# Patient Record
Sex: Female | Born: 1990
Health system: Southern US, Community
[De-identification: ages and names within clinical notes are randomized; demographics above are authoritative.]

## PROBLEM LIST (undated history)

## (undated) ENCOUNTER — Inpatient Hospital Stay (HOSPITAL_COMMUNITY): Payer: Self-pay

## (undated) DIAGNOSIS — K219 Gastro-esophageal reflux disease without esophagitis: Secondary | ICD-10-CM

## (undated) DIAGNOSIS — F419 Anxiety disorder, unspecified: Secondary | ICD-10-CM

## (undated) DIAGNOSIS — F32A Depression, unspecified: Secondary | ICD-10-CM

## (undated) DIAGNOSIS — F329 Major depressive disorder, single episode, unspecified: Secondary | ICD-10-CM

## (undated) HISTORY — PX: WISDOM TOOTH EXTRACTION: SHX21

---

## 2012-12-09 NOTE — L&D Delivery Note (Signed)
I have seen and examined this patient and I agree with the above. Cam Hai 6:17 AM 08/21/2013

## 2012-12-09 NOTE — L&D Delivery Note (Signed)
Delivery Note At 12:35 AM a viable female was delivered via Vaginal, Spontaneous Delivery (Presentation: ; Occiput Anterior).  APGAR: 7, 8; weight 8 lb 2.2 oz (3690 g).   Placenta status: Intact, Spontaneous.  Cord: 3 vessels with the following complications: None.  Anesthesia: Epidural  Episiotomy: None Lacerations: None Suture Repair: n/a Est. Blood Loss (mL): 350  Mom to postpartum.  Baby to nursery-stable.  Jacquelin Hawking, MD 08/21/2013, 2:55 AM

## 2013-02-24 ENCOUNTER — Other Ambulatory Visit (HOSPITAL_COMMUNITY): Payer: Self-pay | Admitting: Nurse Practitioner

## 2013-02-24 DIAGNOSIS — Z3689 Encounter for other specified antenatal screening: Secondary | ICD-10-CM

## 2013-02-24 LAB — OB RESULTS CONSOLE GC/CHLAMYDIA
Chlamydia: NEGATIVE
Gonorrhea: NEGATIVE

## 2013-02-24 LAB — OB RESULTS CONSOLE ABO/RH

## 2013-02-24 LAB — OB RESULTS CONSOLE RPR: RPR: NONREACTIVE

## 2013-02-24 LAB — OB RESULTS CONSOLE HIV ANTIBODY (ROUTINE TESTING): HIV: NONREACTIVE

## 2013-03-26 ENCOUNTER — Ambulatory Visit (HOSPITAL_COMMUNITY)
Admission: RE | Admit: 2013-03-26 | Discharge: 2013-03-26 | Disposition: A | Discharge status: Home / Self Care | Payer: Medicaid Other | Source: Ambulatory Visit | Attending: Nurse Practitioner | Admitting: Nurse Practitioner

## 2013-03-26 ENCOUNTER — Ambulatory Visit (HOSPITAL_COMMUNITY): Payer: Self-pay

## 2013-03-26 DIAGNOSIS — Z1389 Encounter for screening for other disorder: Secondary | ICD-10-CM | POA: Insufficient documentation

## 2013-03-26 DIAGNOSIS — O358XX0 Maternal care for other (suspected) fetal abnormality and damage, not applicable or unspecified: Secondary | ICD-10-CM | POA: Insufficient documentation

## 2013-03-26 DIAGNOSIS — Z363 Encounter for antenatal screening for malformations: Secondary | ICD-10-CM | POA: Insufficient documentation

## 2013-03-26 DIAGNOSIS — Z3689 Encounter for other specified antenatal screening: Secondary | ICD-10-CM

## 2013-04-01 ENCOUNTER — Other Ambulatory Visit (HOSPITAL_COMMUNITY): Payer: Self-pay | Admitting: Physician Assistant

## 2013-04-01 DIAGNOSIS — Z0489 Encounter for examination and observation for other specified reasons: Secondary | ICD-10-CM

## 2013-04-01 DIAGNOSIS — IMO0002 Reserved for concepts with insufficient information to code with codable children: Secondary | ICD-10-CM

## 2013-04-06 ENCOUNTER — Encounter (HOSPITAL_COMMUNITY): Payer: Self-pay

## 2013-04-06 ENCOUNTER — Ambulatory Visit (HOSPITAL_COMMUNITY)
Admission: RE | Admit: 2013-04-06 | Discharge: 2013-04-06 | Disposition: A | Discharge status: Home / Self Care | Payer: Medicaid Other | Source: Ambulatory Visit | Attending: Physician Assistant | Admitting: Physician Assistant

## 2013-04-06 DIAGNOSIS — Z0489 Encounter for examination and observation for other specified reasons: Secondary | ICD-10-CM

## 2013-04-06 DIAGNOSIS — IMO0002 Reserved for concepts with insufficient information to code with codable children: Secondary | ICD-10-CM

## 2013-04-06 DIAGNOSIS — Z3689 Encounter for other specified antenatal screening: Secondary | ICD-10-CM | POA: Insufficient documentation

## 2013-04-09 ENCOUNTER — Ambulatory Visit (HOSPITAL_COMMUNITY): Payer: Medicaid Other

## 2013-05-31 ENCOUNTER — Encounter: Payer: Self-pay | Admitting: *Deleted

## 2013-05-31 ENCOUNTER — Ambulatory Visit (INDEPENDENT_AMBULATORY_CARE_PROVIDER_SITE_OTHER): Payer: Medicaid Other | Admitting: Cardiovascular Disease

## 2013-05-31 VITALS — BP 116/66 | HR 90 | Ht 62.5 in | Wt 172.8 lb

## 2013-05-31 DIAGNOSIS — R002 Palpitations: Secondary | ICD-10-CM | POA: Insufficient documentation

## 2013-05-31 NOTE — Patient Instructions (Addendum)
STAY HYDRATED   Your physician wants you to follow-up ON AS NEEDED BASIS.

## 2013-05-31 NOTE — Progress Notes (Signed)
     Joyce Schneider Date of Birth  1991-12-07       [facility]    [place] [address], [address]  [address], [address] [place], [place]  [address]   [place], [place]  [address] [phone]     [phone]   Fax  [phone]    Fax [phone]  Problem List: 1. Tachypalpitations 2.   History of Present Illness:  Joyce Schneider is a 22 yo - currently [redacted] weeks pregnant .  She presents today with tachypalps - started 2-3 weeks ago.  The palpitations last 1-2 minutes - perhaps as long as 3 minutes.  They cause her to feel somewhat short of breath and fatigued.   She has just started to exercise ( walking and zumba) and feels better this week after starting to exercise.    She is eating well.  Staying well hydrated.    No current outpatient prescriptions on file prior to visit.   No current facility-administered medications on file prior to visit.    No Known Allergies  History reviewed. No pertinent past medical history.  No past surgical history on file.  History  Smoking status  . Never Smoker   Smokeless tobacco  . Not on file    History  Alcohol Use No    History reviewed. No pertinent family history.  Reviw of Systems:  Reviewed in the HPI.  All other systems are negative.  Physical Exam: Blood pressure 116/66, pulse 90, height 5' 2.5" (1.588 m), weight 172 lb 12.8 oz (78.382 kg). General: Well developed, well nourished, in no acute distress.  Head: Normocephalic, atraumatic, sclera non-icteric, mucus membranes are moist,   Neck: Supple. Carotids are 2 + without bruits. No JVD   Lungs: Clear   Heart: RR, normal S1, S2  Abdomen: pregnant  Msk:  Strength and tone are normal   Extremities: No clubbing or cyanosis. Trace edema.  Distal pedal pulses are 2+ and equal    Neuro: CN II - XII intact.  Alert and oriented X 3.   Psych:  Normal   ECG: May 31, 2013:  NSR at 90. Normal ECG  Assessment / Plan:

## 2013-05-31 NOTE — Assessment & Plan Note (Signed)
Joyce Schneider  presents today with a complaint of palpitations. Her palpitations sound very benign. She likely is having premature ventricular contractions or perhaps premature atrial contractions. She thinks that these episodes may last for a minute or so but they have not caused her to be syncopal or presyncopal. She does have some fatigue.  She is currently [redacted] weeks pregnant.  She feels better now that she has been exercising.  I reassured her that these arrhythmias are benign. I've asked her to her pulses she has a sustained arrhythmia and to call me if her heart rate is greater than 150. I told her that her heart rate may get up to 110 or 120 and this would not be of any concern. I've asked her to stay well hydrated.  I'll see her back on an as-needed basis.

## 2013-07-30 ENCOUNTER — Inpatient Hospital Stay (HOSPITAL_COMMUNITY)
Admission: AD | Admit: 2013-07-30 | Discharge: 2013-07-30 | Disposition: A | Discharge status: Home / Self Care | Payer: Medicaid Other | Source: Ambulatory Visit | Attending: Obstetrics & Gynecology | Admitting: Obstetrics & Gynecology

## 2013-07-30 ENCOUNTER — Encounter (HOSPITAL_COMMUNITY): Payer: Self-pay | Admitting: *Deleted

## 2013-07-30 DIAGNOSIS — O99891 Other specified diseases and conditions complicating pregnancy: Secondary | ICD-10-CM | POA: Insufficient documentation

## 2013-07-30 DIAGNOSIS — O479 False labor, unspecified: Secondary | ICD-10-CM

## 2013-07-30 DIAGNOSIS — O4700 False labor before 37 completed weeks of gestation, unspecified trimester: Secondary | ICD-10-CM | POA: Insufficient documentation

## 2013-07-30 LAB — POCT FERN TEST: POCT Fern Test: NEGATIVE

## 2013-07-30 NOTE — MAU Note (Signed)
We were at store ago and I felt wet and underwear was wet. Contractions today but i haven't had any pain until waiting in lobby

## 2013-07-30 NOTE — MAU Note (Signed)
Patient states had appointment yesterday at [facility] department and was given medication for yeast infection. States had cervical check also and was 2cm. Reports good fetal movement. Denies bleeding.

## 2013-07-30 NOTE — MAU Provider Note (Signed)
  History     CSN: [id number]  Arrival date and time: 07/30/13 2039   First Provider Initiated Contact with Patient 07/30/13 2123      Chief Complaint  Patient presents with  . Contractions  . Rupture of Membranes   HPI  Joyce Schneider is a 22 y.o. G1P0 at [redacted]w[redacted]d who presents for a labor evaluation and rule out rupture of membranes. She reports she began feeling a painless tightening of her abdomen today. Also earlier today noticed her underwear was wet. This happened two times. It has not been a continuous trickle of fluid. Baby last moved within the last hour. No vaginal bleeding. Denies having any problems in this pregnancy.  OB History   Grav Para Term Preterm Abortions TAB SAB Ect Mult Living   1               History reviewed. No pertinent past medical history.  Past Surgical History  Procedure Laterality Date  . Wisdom tooth extraction      Family History  Problem Relation Age of Onset  . Hypertension Father     History  Substance Use Topics  . Smoking status: Never Smoker   . Smokeless tobacco: Not on file  . Alcohol Use: No    Allergies: No Known Allergies  Prescriptions prior to admission  Medication Sig Dispense Refill  . Prenatal Vit-Fe Fumarate-FA (PRENATAL PO) Take by mouth daily.        ROS + ctx, ?LOF, no bleeding, + fetal movement  Physical Exam   Blood pressure 126/85, pulse 109, temperature 98.1 F (36.7 C), resp. rate 18, height 5' 1.5" (1.562 m), weight 183 lb (83.008 kg), SpO2 100.00%. Physical Exam Gen: NAD Lungs:NWOB Neuro: grossly nonfocal, speech intact GU: normal appearing external genitalia. Sterile speculum shows no gross pooling of clear fluid. Dilation: 1 Effacement (%): 90 Cervical Position: Posterior Station: -3 Presentation: Vertex Exam by:: Dr. Pollie Meyer; Elonda Husky RN   MAU Course  Procedures  MDM Crist Fat slide negative No pooling on speculum exam  FHR: baseline 130s, moderate variability, + accels, no  decels Toco: ctx q2-5 minutes   Assessment and Plan  Joyce Schneider is a 22 y.o. G1P0 at [redacted]w[redacted]d who presents for labor eval / rule out rupture of membranes. With negative pooling and fern, very unlikely that ROM has taken place. Will observe for 1 hour and recheck cervix. If unchanged at that time, okay to d/c home with labor precautions.  Update: cervix rechecked by RN and is unchanged. Will d/c home.  Levert Feinstein 07/30/2013, 9:41 PM   I have seen and examined this patient and I agree with the above. Cam Hai 3:29 AM 07/31/2013

## 2013-08-19 ENCOUNTER — Encounter (HOSPITAL_COMMUNITY): Payer: Self-pay | Admitting: *Deleted

## 2013-08-19 ENCOUNTER — Inpatient Hospital Stay (HOSPITAL_COMMUNITY)
Admission: AD | Admit: 2013-08-19 | Discharge: 2013-08-23 | DRG: 775 | Disposition: A | Discharge status: Home / Self Care | Payer: Medicaid Other | Source: Ambulatory Visit | Attending: Obstetrics & Gynecology | Admitting: Obstetrics & Gynecology

## 2013-08-19 DIAGNOSIS — Z3490 Encounter for supervision of normal pregnancy, unspecified, unspecified trimester: Secondary | ICD-10-CM

## 2013-08-19 DIAGNOSIS — O4292 Full-term premature rupture of membranes, unspecified as to length of time between rupture and onset of labor: Secondary | ICD-10-CM

## 2013-08-19 DIAGNOSIS — O4290 Premature rupture of membranes, unspecified as to length of time between rupture and onset of labor, unspecified weeks of gestation: Principal | ICD-10-CM | POA: Diagnosis present

## 2013-08-19 LAB — CBC
MCH: 28.1 pg (ref 26.0–34.0)
Platelets: 151 10*3/uL (ref 150–400)
RBC: 4.73 MIL/uL (ref 3.87–5.11)
WBC: 10.5 10*3/uL (ref 4.0–10.5)

## 2013-08-19 LAB — POCT FERN TEST: POCT Fern Test: POSITIVE

## 2013-08-19 LAB — RPR: RPR Ser Ql: NONREACTIVE

## 2013-08-19 MED ORDER — OXYCODONE-ACETAMINOPHEN 5-325 MG PO TABS: 1.0000 | ORAL_TABLET | ORAL | Status: DC | PRN

## 2013-08-19 MED ORDER — FENTANYL CITRATE 0.05 MG/ML IJ SOLN
50.0000 ug | INTRAMUSCULAR | Status: DC | PRN
  Administered 2013-08-20 (×4): 50 ug via INTRAVENOUS
  Filled 2013-08-19 (×5): qty 2

## 2013-08-19 MED ORDER — OXYTOCIN 40 UNITS IN LACTATED RINGERS INFUSION - SIMPLE MED
1.0000 m[IU]/min | INTRAVENOUS | Status: DC
  Administered 2013-08-20: 2 m[IU]/min via INTRAVENOUS

## 2013-08-19 MED ORDER — OXYTOCIN BOLUS FROM INFUSION: 500.0000 mL | INTRAVENOUS | Status: DC

## 2013-08-19 MED ORDER — ACETAMINOPHEN 325 MG PO TABS
650.0000 mg | ORAL_TABLET | ORAL | Status: DC | PRN
  Administered 2013-08-20: 650 mg via ORAL
  Filled 2013-08-19: qty 2

## 2013-08-19 MED ORDER — LACTATED RINGERS IV SOLN
INTRAVENOUS | Status: DC
  Administered 2013-08-20 (×3): via INTRAVENOUS

## 2013-08-19 MED ORDER — TERBUTALINE SULFATE 1 MG/ML IJ SOLN: 0.2500 mg | Freq: Once | INTRAMUSCULAR | Status: AC | PRN

## 2013-08-19 MED ORDER — ONDANSETRON HCL 4 MG/2ML IJ SOLN
4.0000 mg | Freq: Four times a day (QID) | INTRAMUSCULAR | Status: DC | PRN
  Administered 2013-08-20: 4 mg via INTRAVENOUS
  Filled 2013-08-19: qty 2

## 2013-08-19 MED ORDER — OXYTOCIN 40 UNITS IN LACTATED RINGERS INFUSION - SIMPLE MED
62.5000 mL/h | INTRAVENOUS | Status: DC
  Filled 2013-08-19: qty 1000

## 2013-08-19 MED ORDER — CITRIC ACID-SODIUM CITRATE 334-500 MG/5ML PO SOLN: 30.0000 mL | ORAL | Status: DC | PRN

## 2013-08-19 MED ORDER — IBUPROFEN 600 MG PO TABS
600.0000 mg | ORAL_TABLET | Freq: Four times a day (QID) | ORAL | Status: DC | PRN
  Administered 2013-08-21: 600 mg via ORAL
  Filled 2013-08-19: qty 1

## 2013-08-19 MED ORDER — ZOLPIDEM TARTRATE 5 MG PO TABS: 5.0000 mg | ORAL_TABLET | Freq: Every evening | ORAL | Status: DC | PRN

## 2013-08-19 MED ORDER — LACTATED RINGERS IV SOLN: 500.0000 mL | INTRAVENOUS | Status: DC | PRN

## 2013-08-19 MED ORDER — LIDOCAINE HCL (PF) 1 % IJ SOLN
30.0000 mL | INTRAMUSCULAR | Status: DC | PRN
  Filled 2013-08-19: qty 30

## 2013-08-19 NOTE — Progress Notes (Signed)
Joyce Schneider is a 22 y.o. G1P0 at [redacted]w[redacted]d by admitted for rupture of membranes  Subjective: Doing well. Contractions not painful. Husband and mother-in-law in the room and supportive.  Objective: BP 137/80  Pulse 102  Temp(Src) 98.6 F (37 C) (Oral)  Resp 18  Ht 5' 1.5" (1.562 m)  Wt 85.276 kg (188 lb)  BMI 34.95 kg/m2     FHT:  FHR: 130 bpm, variability: moderate,  accelerations:  Present,  decelerations:  Absent UC:   regular, every 2-3 minutes SVE:   Dilation: 1.5 Effacement (%): 60 Station: -2 Exam by:: dr. Madie Reno  Labs: Lab Results  Component Value Date   WBC 10.5 08/19/2013   HGB 13.3 08/19/2013   HCT 38.5 08/19/2013   MCV 81.4 08/19/2013   PLT 151 08/19/2013    Assessment / Plan: Augmentation of labor, progressing well but cvx unchanged in 3 hours with PROM. Placed Foley bulb. Will start pitocin when cervix 3-4cm.   Labor: with need of induction, unable to give cytotec since contracting >3/86min. Placed Foley Bulub. Preeclampsia:  no signs or symptoms of toxicity and NA Fetal Wellbeing:  Category I Pain Control:  Labor support without medications I/D:  n/a Anticipated MOD:  NSVD  Marissa Calamity 08/19/2013, 6:44 PM  I spoke with and examined patient and agree with resident's note and plan of care.  Tawana Scale, MD OB Fellow 06/08/2014 7:20 PM

## 2013-08-19 NOTE — Progress Notes (Signed)
   Joyce Schneider is a 22 y.o. G1P0 at [redacted]w[redacted]d  admitted for PROM  Subjective: Contractions are not really stronger than before. LEaking small amounts of clear fluid  Objective: BP 137/75  Pulse 100  Temp(Src) 98.4 F (36.9 C) (Oral)  Resp 18  Ht 5' 1.5" (1.562 m)  Wt 85.276 kg (188 lb)  BMI 34.95 kg/m2    FHT:  FHR: 150 bpm, variability: moderate,  accelerations:  Present,  decelerations:  Absent UC:   irregular, every 2-5 minutes SVE:   Deferred.  Foley still in Labs: Lab Results  Component Value Date   WBC 10.5 08/19/2013   HGB 13.3 08/19/2013   HCT 38.5 08/19/2013   MCV 81.4 08/19/2013   PLT 151 08/19/2013    Assessment / Plan: PROM at term, ripening phase of IOL Will start Pitocin when foley falls out Labor: no Fetal Wellbeing:  Category I Pain Control:  Labor support without medications Anticipated MOD:  NSVD  CRESENZO-DISHMAN,Aijah Lattner 08/19/2013, 10:22 PM

## 2013-08-19 NOTE — H&P (Signed)
Joyce Schneider is a 22 y.o. female presenting for possible rupture of membranes at home. [facility] and they did a fern test (which they sent with her) that was positive. Was thinking maybe her membranes broke this AM but wasn't sure. Having some contractions that are very mild and not uncomfortable. No regular contractions.  Maternal Medical History:  Reason for admission: Rupture of membranes.   Contractions: Onset was 1-2 hours ago.   Frequency: rare.   Perceived severity is mild.    Fetal activity: Perceived fetal activity is normal.   Last perceived fetal movement was within the past 12 hours.    Prenatal complications: no prenatal complications No bleeding, cholelithiasis, HIV, hypertension, infection, IUGR, nephrolithiasis, oligohydramnios, placental abnormality, polyhydramnios, pre-eclampsia, preterm labor, substance abuse, thrombocytopenia or thrombophilia.   Prenatal Complications - Diabetes: none.    OB History   Grav Para Term Preterm Abortions TAB SAB Ect Mult Living   1              Past Medical History  Diagnosis Date  . Medical history non-contributory    Past Surgical History  Procedure Laterality Date  . Wisdom tooth extraction     Family History: family history includes Hypertension in her father. Social History:  reports that she has never smoked. She does not have any smokeless tobacco history on file. She reports that she does not drink alcohol or use illicit drugs.   Prenatal Transfer Tool  Maternal Diabetes: No Genetic Screening: Normal Maternal Ultrasounds/Referrals: Normal Fetal Ultrasounds or other Referrals:  None Maternal Substance Abuse:  No Significant Maternal Medications:  None Significant Maternal Lab Results:  None Other Comments:  None  Review of Systems  All other systems reviewed and are negative.    Dilation: 1.5 Effacement (%): 60 Station: -2 Exam by:: Dr. Madie Reno Blood pressure 128/81, pulse  104, temperature 98.3 F (36.8 C), temperature source Oral. Maternal Exam:  Uterine Assessment: Contraction strength is mild.  Contraction frequency is irregular.   Abdomen: Patient reports no abdominal tenderness. Fetal presentation: vertex  Introitus: Ferning test: positive.  Nitrazine test: not done. Amniotic fluid character: clear.  Pelvis: adequate for delivery.   Cervix: not evaluated.   Fetal Exam Fetal Monitor Review: Mode: ultrasound.   Baseline rate: 130.  Variability: moderate (6-25 bpm).   Pattern: accelerations present and no decelerations.    Fetal State Assessment: Category I - tracings are normal.     Physical Exam  Vitals reviewed. Constitutional: She appears well-developed and well-nourished.  HENT:  Head: Normocephalic.  Cardiovascular: Normal rate.   Respiratory: Effort normal.  GI: Soft.    Prenatal labs: ABO, Rh:  A pos Antibody:  neg Rubella:  immune RPR:   neg HBsAg:   neg HIV:   NR GBS:   Negative  Assessment/Plan: 1) PROM - Admit to L&D. Expectant mgmt. Will augment labor with pitocin if not making cervical change. GBS neg.   Marissa Calamity 08/19/2013, 3:31 PM  #Labor: Admit to L&D for monitoring, currently ctx, in no change will augment with pitocin #Pain: Desires natural birth #FWB: Cat I, continuous mointoring #ID: GBS negative  I spoke with and examined patient and agree with resident's note and plan of care.  Tawana Scale, MD OB Fellow 08/19/2013 4:10 PM

## 2013-08-19 NOTE — MAU Note (Signed)
[facility]. Pt reports  she has had an increase in yellow/green discharge. Reports increased pelvic pressure and cramping that is not very painful. Good fetal movement reported.

## 2013-08-20 ENCOUNTER — Encounter (HOSPITAL_COMMUNITY): Payer: Self-pay | Admitting: Anesthesiology

## 2013-08-20 ENCOUNTER — Inpatient Hospital Stay (HOSPITAL_COMMUNITY): Payer: Medicaid Other | Admitting: Anesthesiology

## 2013-08-20 LAB — CBC
Hemoglobin: 13.4 g/dL (ref 12.0–15.0)
MCHC: 34.4 g/dL (ref 30.0–36.0)
RDW: 15.8 % — ABNORMAL HIGH (ref 11.5–15.5)

## 2013-08-20 LAB — COMPREHENSIVE METABOLIC PANEL
AST: 20 U/L (ref 0–37)
CO2: 17 mEq/L — ABNORMAL LOW (ref 19–32)
Calcium: 9.4 mg/dL (ref 8.4–10.5)
Creatinine, Ser: 0.76 mg/dL (ref 0.50–1.10)
GFR calc non Af Amer: >90 mL/min (ref >90)

## 2013-08-20 LAB — ABO/RH: ABO/RH(D): A POS

## 2013-08-20 LAB — TYPE AND SCREEN
ABO/RH(D): A POS
Antibody Screen: NEGATIVE

## 2013-08-20 LAB — PROTEIN / CREATININE RATIO, URINE
Protein Creatinine Ratio: 0.33 — ABNORMAL HIGH (ref 0.00–0.15)
Total Protein, Urine: 93.8 mg/dL

## 2013-08-20 MED ORDER — EPHEDRINE 5 MG/ML INJ
INTRAVENOUS | Status: AC
  Filled 2013-08-20: qty 4

## 2013-08-20 MED ORDER — PHENYLEPHRINE 40 MCG/ML (10ML) SYRINGE FOR IV PUSH (FOR BLOOD PRESSURE SUPPORT)
80.0000 ug | PREFILLED_SYRINGE | INTRAVENOUS | Status: DC | PRN
  Filled 2013-08-20: qty 2

## 2013-08-20 MED ORDER — FENTANYL 2.5 MCG/ML BUPIVACAINE 1/10 % EPIDURAL INFUSION (WH - ANES)
14.0000 mL/h | INTRAMUSCULAR | Status: DC | PRN
  Administered 2013-08-20: 14 mL/h via EPIDURAL

## 2013-08-20 MED ORDER — FENTANYL 2.5 MCG/ML BUPIVACAINE 1/10 % EPIDURAL INFUSION (WH - ANES)
INTRAMUSCULAR | Status: AC
  Filled 2013-08-20: qty 125

## 2013-08-20 MED ORDER — EPHEDRINE 5 MG/ML INJ
10.0000 mg | INTRAVENOUS | Status: DC | PRN
  Filled 2013-08-20: qty 2

## 2013-08-20 MED ORDER — PHENYLEPHRINE 40 MCG/ML (10ML) SYRINGE FOR IV PUSH (FOR BLOOD PRESSURE SUPPORT)
PREFILLED_SYRINGE | INTRAVENOUS | Status: AC
  Filled 2013-08-20: qty 5

## 2013-08-20 MED ORDER — DIPHENHYDRAMINE HCL 50 MG/ML IJ SOLN: 12.5000 mg | INTRAMUSCULAR | Status: DC | PRN

## 2013-08-20 MED ORDER — LIDOCAINE HCL (PF) 1 % IJ SOLN
INTRAMUSCULAR | Status: DC | PRN
  Administered 2013-08-20 (×2): 5 mL

## 2013-08-20 MED ORDER — LACTATED RINGERS IV SOLN: 500.0000 mL | Freq: Once | INTRAVENOUS | Status: DC

## 2013-08-20 MED ORDER — OXYTOCIN 40 UNITS IN LACTATED RINGERS INFUSION - SIMPLE MED
1.0000 m[IU]/min | INTRAVENOUS | Status: DC
  Administered 2013-08-20: 2 m[IU]/min via INTRAVENOUS
  Administered 2013-08-20 (×2): 4 m[IU]/min via INTRAVENOUS

## 2013-08-20 NOTE — Progress Notes (Signed)
Patient ID: Joyce Schneider, female   DOB: 1990/12/12, 22 y.o.   MRN: [id number] Joyce Schneider is a 22 y.o. G1P0 at [redacted]w[redacted]d admitted for IOL for PROM  Subjective: Comfortable with epidural  Objective: BP 125/71  Pulse 121  Temp(Src) 99.1 F (37.3 C) (Axillary)  Resp 18  Ht 5' 1.5" (1.562 m)  Wt 188 lb (85.276 kg)  BMI 34.95 kg/m2  Fetal Heart FHR: 125 bpm, variability: moderate,  accelerations:  Present,  decelerations:  Absent   Contractions:   SVE:   Dilation: 6 Effacement (%): 100 Station: 0 Exam by:: hk Cx recheck by RN: no change. Pitocin started back, now at 4 mu  Assessment / Plan:  Labor: Active Fetal Wellbeing: Cat 1 Pain Control:  adequate Expected mode of delivery: NSVD  Joyce Schneider 08/20/2013, 7:02 PM

## 2013-08-20 NOTE — Anesthesia Procedure Notes (Signed)
Epidural Patient location during procedure: OB Start time: 08/20/2013 6:24 PM  Staffing Anesthesiologist: Angus Seller., Harrell Gave. Performed by: anesthesiologist   Preanesthetic Checklist Completed: patient identified, site marked, surgical consent, pre-op evaluation, timeout performed, IV checked, risks and benefits discussed and monitors and equipment checked  Epidural Patient position: sitting Prep: site prepped and draped and DuraPrep Patient monitoring: continuous pulse ox and blood pressure Approach: midline Injection technique: LOR air and LOR saline  Needle:  Needle type: Tuohy  Needle gauge: 17 G Needle length: 9 cm and 9 Needle insertion depth: 5 cm cm Catheter type: closed end flexible Catheter size: 19 Gauge Catheter at skin depth: 10 cm Test dose: negative  Assessment Events: blood not aspirated, injection not painful, no injection resistance, negative IV test and no paresthesia  Additional Notes Patient identified.  Risk benefits discussed including failed block, incomplete pain control, headache, nerve damage, paralysis, blood pressure changes, nausea, vomiting, reactions to medication both toxic or allergic, and postpartum back pain.  Patient expressed understanding and wished to proceed.  All questions were answered.  Sterile technique used throughout procedure and epidural site dressed with sterile barrier dressing. No paresthesia or other complications noted.The patient did not experience any signs of intravascular injection such as tinnitus or metallic taste in mouth nor signs of intrathecal spread such as rapid motor block. Please see nursing notes for vital signs.

## 2013-08-20 NOTE — Progress Notes (Signed)
Joyce Schneider is a 22 y.o. G1P0 at [redacted]w[redacted]d   Subjective: Feeling pressure/urge to push  Objective: BP 119/63  Pulse 140  Temp(Src) 99.2 F (37.3 C) (Axillary)  Resp 20  Ht 5' 1.5" (1.562 m)  Wt 85.276 kg (188 lb)  BMI 34.95 kg/m2      FHT:  FHR: 140-150 bpm, variability: moderate,  accelerations:  Present,  decelerations:  Absent occ mi variables UC:   regular, every 2-3 minutes with Pitocin SVE:   Dilation: 10 Effacement (%): 90 Station: +2 Exam by:: A.Davis,RN  Labs: Lab Results  Component Value Date   WBC 14.5* 08/20/2013   HGB 13.4 08/20/2013   HCT 39.0 08/20/2013   MCV 81.8 08/20/2013   PLT 136* 08/20/2013    Assessment / Plan: End 1st stage  Will begin pushing with urge Anticipate SVD  SHAW, KIMBERLY 08/20/2013, 11:16 PM

## 2013-08-20 NOTE — Progress Notes (Signed)
Patient ID: Joyce Schneider, female   DOB: [birth year], 22 y.o.   MRN: [id number] Joyce Schneider is a 22 y.o. G1P0 at [redacted]w[redacted]d admitted for IOL  Subjective: Coping well. Has had IV pain meds.   Objective: BP 133/82  Pulse 86  Temp(Src) 98.8 F (37.1 C) (Oral)  Resp 20  Ht 5' 1.5" (1.562 m)  Wt 188 lb (85.276 kg)  BMI 34.95 kg/m2  Fetal Heart FHR: 125 bpm, variability: moderate,  accelerations:  Present,  decelerations:  Absent   Contractions: q1-3. RN d/c'd pitocin due to UC frequency  SVE:   Dilation: 5.5 Effacement (%): 100 Station: 0 Exam by:: hk Dilation: 5.5 Effacement (%): 100 Cervical Position: Anterior Station: 0 Presentation: Vertex Exam by:: hk VE: 6-7/95/0, OA, clear AF (1555) Assessment / Plan:  Labor: Active Fetal Wellbeing: Cat 1 Pain Control:  IV meds Expected mode of delivery: NSVD  Joyce Schneider 08/20/2013, 3:53 PM

## 2013-08-20 NOTE — Progress Notes (Signed)
This note also relates to the following rows which could not be included: Dose (milli-units/min) Oxytocin - Cannot attach notes to extension rows Rate (mL/hr) Oxytocin - Cannot attach notes to extension rows Concentration Oxytocin - Cannot attach notes to extension rows   F. Cresenzo-Dishmon, cnm at bedside. Pitocin discontinued so pt can eat.  Will restart after breakfast.

## 2013-08-20 NOTE — Anesthesia Preprocedure Evaluation (Signed)

## 2013-08-20 NOTE — Progress Notes (Signed)
Dr. Penne Lash on unit.  Orders to go ahead and start pitocin with foley bulb in place.  SVE done.  Baby in vertex position.  Foley bulb tugged on still tight in cervix.  Pitocin started per orders

## 2013-08-20 NOTE — Progress Notes (Signed)
Patient ID: Mauriah Mcmillen, female   DOB: [birth year], 22 y.o.   MRN: [id number] Brody Kump is a 22 y.o. G1P0 at [redacted]w[redacted]d admitted for PROM  Subjective: Coping with contraction pain. Had FB, then pitocin. No H/A, visual sx, epigastric pain.   Objective: BP 137/95  Pulse 96  Temp(Src) 98.7 F (37.1 C) (Oral)  Resp 18  Ht 5' 1.5" (1.562 m)  Wt 188 lb (85.276 kg)  BMI 34.95 kg/m2 Filed Vitals:   08/20/13 0952 08/20/13 1040 08/20/13 1112 08/20/13 1204  BP: 145/91 129/71 133/84 137/95  Pulse: 95 82 94 96  Temp: 97.9 F (36.6 C)   98.7 F (37.1 C)  TempSrc: Oral   Oral  Resp:    18  Height:      Weight:       Fetal Heart FHR: 130 bpm, variability: moderate,  accelerations:  Present,  decelerations:  Absent   Contractions: q x50-60 sec  SVE:   Dilation: 6 Effacement (%): 90 Station: -1 Exam by:: tina, snm/Linet Brash, cnm Cx unchanged from RN last exam. UCs frequent on pitocin 2 mu. Bulging forebag>AROM clear  Assessment / Plan: CMP and P:C ratio sent due to 4 elevated BPs noted Labor: Active Fetal Wellbeing: Cat 1 Pain Control:  Declines analgesia Expected mode of delivery: NSVD  Anays Detore 08/20/2013, 12:39 PM

## 2013-08-21 ENCOUNTER — Encounter (HOSPITAL_COMMUNITY): Payer: Self-pay | Admitting: *Deleted

## 2013-08-21 DIAGNOSIS — O4290 Premature rupture of membranes, unspecified as to length of time between rupture and onset of labor, unspecified weeks of gestation: Secondary | ICD-10-CM

## 2013-08-21 MED ORDER — ONDANSETRON HCL 4 MG/2ML IJ SOLN
4.0000 mg | INTRAMUSCULAR | Status: DC | PRN
Start: 2013-08-21 — End: 2013-08-23

## 2013-08-21 MED ORDER — BENZOCAINE-MENTHOL 20-0.5 % EX AERO
1.0000 "application " | INHALATION_SPRAY | CUTANEOUS | Status: DC | PRN
  Administered 2013-08-21 – 2013-08-23 (×2): 1 via TOPICAL
  Filled 2013-08-21 (×2): qty 56

## 2013-08-21 MED ORDER — SENNOSIDES-DOCUSATE SODIUM 8.6-50 MG PO TABS
2.0000 | ORAL_TABLET | ORAL | Status: DC
  Administered 2013-08-21 – 2013-08-22 (×2): 2 via ORAL

## 2013-08-21 MED ORDER — DIBUCAINE 1 % RE OINT: 1.0000 "application " | TOPICAL_OINTMENT | RECTAL | Status: DC | PRN

## 2013-08-21 MED ORDER — INFLUENZA VAC SPLIT QUAD 0.5 ML IM SUSP
0.5000 mL | INTRAMUSCULAR | Status: AC
  Administered 2013-08-22: 0.5 mL via INTRAMUSCULAR
  Filled 2013-08-21: qty 0.5

## 2013-08-21 MED ORDER — OXYCODONE-ACETAMINOPHEN 5-325 MG PO TABS
1.0000 | ORAL_TABLET | ORAL | Status: DC | PRN
  Administered 2013-08-21 – 2013-08-23 (×8): 1 via ORAL
  Filled 2013-08-21 (×8): qty 1

## 2013-08-21 MED ORDER — ONDANSETRON HCL 4 MG PO TABS: 4.0000 mg | ORAL_TABLET | ORAL | Status: DC | PRN

## 2013-08-21 MED ORDER — IBUPROFEN 600 MG PO TABS
600.0000 mg | ORAL_TABLET | Freq: Four times a day (QID) | ORAL | Status: DC
  Administered 2013-08-21 – 2013-08-23 (×8): 600 mg via ORAL
  Filled 2013-08-21 (×8): qty 1

## 2013-08-21 MED ORDER — SIMETHICONE 80 MG PO CHEW: 80.0000 mg | CHEWABLE_TABLET | ORAL | Status: DC | PRN

## 2013-08-21 MED ORDER — LANOLIN HYDROUS EX OINT: TOPICAL_OINTMENT | CUTANEOUS | Status: DC | PRN

## 2013-08-21 MED ORDER — PRENATAL MULTIVITAMIN CH
1.0000 | ORAL_TABLET | Freq: Every day | ORAL | Status: DC
  Administered 2013-08-21 – 2013-08-22 (×2): 1 via ORAL
  Filled 2013-08-21 (×2): qty 1

## 2013-08-21 MED ORDER — WITCH HAZEL-GLYCERIN EX PADS
1.0000 "application " | MEDICATED_PAD | CUTANEOUS | Status: DC | PRN
  Administered 2013-08-21: 1 via TOPICAL

## 2013-08-21 MED ORDER — ERYTHROMYCIN 5 MG/GM OP OINT
TOPICAL_OINTMENT | OPHTHALMIC | Status: AC
  Filled 2013-08-21: qty 1

## 2013-08-21 MED ORDER — ZOLPIDEM TARTRATE 5 MG PO TABS: 5.0000 mg | ORAL_TABLET | Freq: Every evening | ORAL | Status: DC | PRN

## 2013-08-21 MED ORDER — TETANUS-DIPHTH-ACELL PERTUSSIS 5-2.5-18.5 LF-MCG/0.5 IM SUSP
0.5000 mL | Freq: Once | INTRAMUSCULAR | Status: AC
  Administered 2013-08-21: 0.5 mL via INTRAMUSCULAR

## 2013-08-21 MED ORDER — DIPHENHYDRAMINE HCL 25 MG PO CAPS: 25.0000 mg | ORAL_CAPSULE | Freq: Four times a day (QID) | ORAL | Status: DC | PRN

## 2013-08-21 NOTE — Lactation Note (Signed)
This note was copied from the chart of Boy Lauryn Lizardi. Lactation Consultation Note Initial consultation, baby now 52 hours old, mom states breast feeding has not gone well so far, states her nipples are flat and baby does not latch. Offered to assist, mom accepts. Assisted to place baby in football on left, mom's areola are very edematous and the nipple flattens out with compression. Baby unable to get a latch. Initiated nipple shield, baby latched better. Unable to hand express visible colostrum, mom concerned that baby isn't getting any milk; nipple shield dry. Discussed options to supplement baby; discussed the pros and cons of bottle vs. Supplementing at the breast. Mom and dad agree to try curved tip syringe at the nipple shield. Demonstrated procedure to dad, dad then fed baby 5 mL formula using curved tip syringe inserted into nipple shield. Mom and dad feel better about baby being fed and feeding at the breast. Reviewed br feeding basics, reviewed baby and me book, lactation brochure, community resources with mom; answered questions. Enc mom to continue frequent STS and cue based feeding, to continue attempting to feed baby at the breast, and to supplement as needed. Enc mom to call for help if she has any concerns.   Patient Name: Boy Minela [place] [id number] Date: 08/21/2013 Reason for consult: Initial assessment   Maternal Data Formula Feeding for Exclusion: Yes Reason for exclusion: Mother's choice to formula and breast feed on admission Infant to breast within first hour of birth: No (try; did not latch) Has patient been taught Hand Expression?: Yes Does the patient have breastfeeding experience prior to this delivery?: No  Feeding Feeding Type: Formula Length of feed: 30 min  LATCH Score/Interventions Latch: Repeated attempts needed to sustain latch, nipple held in mouth throughout feeding, stimulation needed to elicit sucking reflex. Intervention(s): Skin to skin;Teach feeding  cues;Waking techniques Intervention(s): Breast massage;Breast compression;Assist with latch;Adjust position  Audible Swallowing: Spontaneous and intermittent (using syringe with formula)  Type of Nipple: Flat Intervention(s): Reverse pressure;Hand pump  Comfort (Breast/Nipple): Soft / non-tender     Hold (Positioning): Assistance needed to correctly position infant at breast and maintain latch. Intervention(s): Breastfeeding basics reviewed;Support Pillows;Position options;Skin to skin  LATCH Score: 7  Lactation Tools Discussed/Used Tools: Nipple Dorris Carnes;Other (comment) (curved tip syringe at nipple shield)   Consult Status Consult Status: Follow-up    Octavio Manns [facility] 08/21/2013, 3:08 PM

## 2013-08-21 NOTE — Anesthesia Postprocedure Evaluation (Signed)
  Anesthesia Post-op Note  Patient: Joyce Schneider  Procedure(s) Performed: * No procedures listed *  Patient Location: Mother/Baby  Anesthesia Type:Epidural  Level of Consciousness: alert   Airway and Oxygen Therapy: Patient Spontanous Breathing  Post-op Pain: mild  Post-op Assessment: Patient's Cardiovascular Status Stable, Respiratory Function Stable, No signs of Nausea or vomiting, Pain level controlled, No headache, No residual numbness and No residual motor weakness  Post-op Vital Signs: stable  Complications: No apparent anesthesia complications

## 2013-08-22 MED ORDER — IBUPROFEN 600 MG PO TABS
600.0000 mg | ORAL_TABLET | Freq: Four times a day (QID) | ORAL | Status: DC | PRN
Start: 2013-08-22 — End: 2013-09-01

## 2013-08-22 NOTE — Discharge Instructions (Signed)
Offices that do circumcisions: [facility] [phone] [facility]) $317.20 within 4 weeks of birth, [facility] [phone] [facility]) $250 within 7 days of birth, [facility] [phone] [facility]) $175 within 2 weeks of birth  Postpartum Care After Vaginal Delivery After you deliver your newborn (postpartum period), the usual stay in the hospital is 24 72 hours. If there were problems with your labor or delivery, or if you have other medical problems, you might be in the hospital longer.  While you are in the hospital, you will receive help and instructions on how to care for yourself and your newborn during the postpartum period.  While you are in the hospital:  Be sure to tell your nurses if you have pain or discomfort, as well as where you feel the pain and what makes the pain worse.  If you had an incision made near your vagina (episiotomy) or if you had some tearing during delivery, the nurses may put ice packs on your episiotomy or tear. The ice packs may help to reduce the pain and swelling.  If you are breastfeeding, you may feel uncomfortable contractions of your uterus for a couple of weeks. This is normal. The contractions help your uterus get back to normal size.  It is normal to have some bleeding after delivery.  For the first 1 3 days after delivery, the flow is red and the amount may be similar to a period.  It is common for the flow to start and stop.  In the first few days, you may pass some small clots. Let your nurses know if you begin to pass large clots or your flow increases.  Do not  flush blood clots down the toilet before having the nurse look at them.  During the next 3 10 days after delivery, your flow should become more watery and pink or brown-tinged in color.  Ten to fourteen days after delivery, your flow should be a small amount of yellowish-white discharge.  The amount of your flow will decrease over the first few weeks after delivery.  Your flow may stop in 6 8 weeks. Most women have had their flow stop by 12 weeks after delivery.  You should change your sanitary pads frequently.  Wash your hands thoroughly with soap and water for at least 20 seconds after changing pads, using the toilet, or before holding or feeding your newborn.  You should feel like you need to empty your bladder within the first 6 8 hours after delivery.  In case you become weak, lightheaded, or faint, call your nurse before you get out of bed for the first time and before you take a shower for the first time.  Within the first few days after delivery, your breasts may begin to feel tender and full. This is called engorgement. Breast tenderness usually goes away within 48 72 hours after engorgement occurs. You may also notice milk leaking from your breasts. If you are not breastfeeding, do not stimulate your breasts. Breast stimulation can make your breasts produce more milk.  Spending as much time as possible with your newborn is very important. During this time, you and your newborn can feel close and get to know each other. Having your newborn stay in your room (rooming in) will help to strengthen the bond with your newborn. It will give you time to get to know your newborn and become comfortable caring for your newborn.  Your hormones change after delivery. Sometimes the hormone changes can temporarily cause you  to feel sad or tearful. These feelings should not last more than a few days. If these feelings last longer than that, you should talk to your caregiver.  If desired, talk to your caregiver about methods of family planning or contraception.  Talk to your caregiver about immunizations. Your caregiver may want you to have the following immunizations before leaving the hospital:  Tetanus, diphtheria, and pertussis (Tdap) or tetanus and diphtheria (Td) immunization. It is very important that you and your family (including grandparents) or others  caring for your newborn are up-to-date with the Tdap or Td immunizations. The Tdap or Td immunization can help protect your newborn from getting ill.  Rubella immunization.  Varicella (chickenpox) immunization.  Influenza immunization. You should receive this annual immunization if you did not receive the immunization during your pregnancy. Document Released: 09/22/2007 Document Revised: 08/19/2012 Document Reviewed: 07/22/2012 [facility] Patient Information 2014 [place], [place].  Breastfeeding A change in hormones during your pregnancy causes growth of your breast tissue and an increase in number and size of milk ducts. The hormone prolactin allows proteins, sugars, and fats from your blood supply to make breast milk in your milk-producing glands. The hormone progesterone prevents breast milk from being released before the birth of your baby. After the birth of your baby, your progesterone level decreases allowing breast milk to be released. Thoughts of your baby, as well as his or her sucking or crying, can stimulate the release of milk from the milk-producing glands. Deciding to breastfeed (nurse) is one of the best choices you can make for you and your baby. The information that follows gives a brief review of the benefits, as well as other important skills to know about breastfeeding. BENEFITS OF BREASTFEEDING For your baby  The first milk (colostrum) helps your baby's digestive system function better.   There are antibodies in your milk that help your baby fight off infections.   Your baby has a lower incidence of asthma, allergies, and sudden infant death syndrome (SIDS).   The nutrients in breast milk are better for your baby than infant formulas.  Breast milk improves your baby's brain development.   Your baby will have less gas, colic, and constipation.  Your baby is less likely to develop other conditions, such as childhood obesity, asthma, or diabetes mellitus. For  you  Breastfeeding helps develop a very special bond between you and your baby.   Breastfeeding is convenient, always available at the correct temperature, and costs nothing.   Breastfeeding helps to burn calories and helps you lose the weight gained during pregnancy.   Breastfeeding makes your uterus contract back down to normal size faster and slows bleeding following delivery.   Breastfeeding mothers have a lower risk of developing osteoporosis or breast or ovarian cancer later in life.  BREASTFEEDING FREQUENCY  A healthy, full-term baby may breastfeed as often as every hour or space his or her feedings to every 3 hours. Breastfeeding frequency will vary from baby to baby.   Newborns should be fed no less than every 2 3 hours during the day and every 4 5 hours during the night. You should breastfeed a minimum of 8 feedings in a 24 hour period.  Awaken your baby to breastfeed if it has been 3 4 hours since the last feeding.  Breastfeed when you feel the need to reduce the fullness of your breasts or when your newborn shows signs of hunger. Signs that your baby may be hungry include:  Increased alertness or activity.  Stretching.  Movement of the head from side to side.  Movement of the head and opening of the mouth when the corner of the mouth or cheek is stroked (rooting).  Increased sucking sounds, smacking lips, cooing, sighing, or squeaking.  Hand-to-mouth movements.  Increased sucking of fingers or hands.  Fussing.  Intermittent crying.  Signs of extreme hunger will require calming and consoling before you try to feed your baby. Signs of extreme hunger may include:  Restlessness.  A loud, strong cry.  Screaming.  Frequent feeding will help you make more milk and will help prevent problems, such as sore nipples and engorgement of the breasts.  BREASTFEEDING   Whether lying down or sitting, be sure that the baby's abdomen is facing your abdomen.    Support your breast with 4 fingers under your breast and your thumb above your nipple. Make sure your fingers are well away from your nipple and your baby's mouth.   Stroke your baby's lips gently with your finger or nipple.   When your baby's mouth is open wide enough, place all of your nipple and as much of the colored area around your nipple (areola) as possible into your baby's mouth.  More areola should be visible above his or her upper lip than below his or her lower lip.  Your baby's tongue should be between his or her lower gum and your breast.  Ensure that your baby's mouth is correctly positioned around the nipple (latched). Your baby's lips should create a seal on your breast.  Signs that your baby has effectively latched onto your nipple include:  Tugging or sucking without pain.  Swallowing heard between sucks.  Absent click or smacking sound.  Muscle movement above and in front of his or her ears with sucking.  Your baby must suck about 2 3 minutes in order to get your milk. Allow your baby to feed on each breast as long as he or she wants. Nurse your baby until he or she unlatches or falls asleep at the first breast, then offer the second breast.  Signs that your baby is full and satisfied include:  A gradual decrease in the number of sucks or complete cessation of sucking.  Falling asleep.  Extension or relaxation of his or her body.  Retention of a small amount of milk in his or her mouth.  Letting go of your breast by himself or herself.  Signs of effective breastfeeding in you include:  Breasts that have increased firmness, weight, and size prior to feeding.  Breasts that are softer after nursing.  Increased milk volume, as well as a change in milk consistency and color by the 5th day of breastfeeding.  Breast fullness relieved by breastfeeding.  Nipples are not sore, cracked, or bleeding.  If needed, break the suction by putting your finger  into the corner of your baby's mouth and sliding your finger between his or her gums. Then, remove your breast from his or her mouth.  It is common for babies to spit up a small amount after a feeding.  Babies often swallow air during feeding. This can make babies fussy. Burping your baby between breasts can help with this.  Vitamin D supplements are recommended for babies who get only breast milk.  Avoid using a pacifier during your baby's first 4 6 weeks.  Avoid supplemental feedings of water, formula, or juice in place of breastfeeding. Breast milk is all the food your baby needs. It is not necessary for  your baby to have water or formula. Your breasts will make more milk if supplemental feedings are avoided during the early weeks. HOW TO TELL WHETHER YOUR BABY IS GETTING ENOUGH BREAST MILK Wondering whether or not your baby is getting enough milk is a common concern among mothers. You can be assured that your baby is getting enough milk if:   Your baby is actively sucking and you hear swallowing.   Your baby seems relaxed and satisfied after a feeding.   Your baby nurses at least 8 12 times in a 24 hour time period.  During the first 66 22 days of age:  Your baby is wetting at least 3 5 diapers in a 24 hour period. The urine should be clear and pale yellow.  Your baby is having at least 3 4 stools in a 24 hour period. The stool should be soft and yellow.  At 34 70 days of age, your baby is having at least 3 6 stools in a 24 hour period. The stool should be seedy and yellow by 59 days of age.  Your baby has a weight loss less than 7 10% during the first 36 days of age.  Your baby does not lose weight after 33 62 days of age.  Your baby gains 4 7 ounces each week after he or she is 16 days of age.  Your baby gains weight by 79 days of age and is back to birth weight within 2 weeks. ENGORGEMENT In the first week after your baby is born, you may experience extremely full breasts  (engorgement). When engorged, your breasts may feel heavy, warm, or tender to the touch. Engorgement peaks within 24 48 hours after delivery of your baby.  Engorgement may be reduced by:  Continuing to breastfeed.  Increasing the frequency of breastfeeding.  Taking warm showers or applying warm, moist heat to your breasts just before each feeding. This increases circulation and helps the milk flow.   Gently massaging your breast before and during the feedings. With your fingertips, massage from your chest wall towards your nipple in a circular motion.   Ensuring that your baby empties at least one breast at every feeding. It also helps to start the next feeding on the opposite breast.   Expressing breast milk by hand or by using a breast pump to empty the breasts if your baby is sleepy, or not nursing well. You may also want to express milk if you are returning to work oryou feel you are getting engorged.  Ensuring your baby is latched on and positioned properly while breastfeeding. If you follow these suggestions, your engorgement should improve in 24 48 hours. If you are still experiencing difficulty, call your lactation consultant or caregiver.  CARING FOR YOURSELF Take care of your breasts.  Bathe or shower daily.   Avoid using soap on your nipples.   Wear a supportive bra. Avoid wearing underwire style bras.  Air dry your nipples for a 3 after each feeding.   Use only cotton bra pads to absorb breast milk leakage. Leaking of breast milk between feedings is normal.   Use only pure lanolin on your nipples after nursing. You do not need to wash it off before feeding your baby again. Another option is to express a few drops of breast milk and gently massage that milk into your nipples.  Continue breast self-awareness checks. Take care of yourself.  Eat healthy foods. Alternate 3 meals with 3 snacks.  Avoid foods  that you notice affect your baby in a bad  way.  Drink milk, fruit juice, and water to satisfy your thirst (about 8 glasses a day).   Rest often, relax, and take your prenatal vitamins to prevent fatigue, stress, and anemia.  Avoid chewing and smoking tobacco.  Avoid alcohol and drug use.  Take over-the-counter and prescribed medicine only as directed by your caregiver or pharmacist. You should always check with your caregiver or pharmacist before taking any new medicine, vitamin, or herbal supplement.  Know that pregnancy is possible while breastfeeding. If desired, talk to your caregiver about family planning and safe birth control methods that may be used while breastfeeding. SEEK MEDICAL CARE IF:   You feel like you want to stop breastfeeding or have become frustrated with breastfeeding.  You have painful breasts or nipples.  Your nipples are cracked or bleeding.  Your breasts are red, tender, or warm.  You have a swollen area on either breast.  You have a fever or chills.  You have nausea or vomiting.  You have drainage from your nipples.  Your breasts do not become full before feedings by the 5th day after delivery.  You feel sad and depressed.  Your baby is too sleepy to eat well.  Your baby is having trouble sleeping.   Your baby is wetting less than 3 diapers in a 24 hour period.  Your baby has less than 3 stools in a 24 hour period.  Your baby's skin or the white part of his or her eyes becomes more yellow.   Your baby is not gaining weight by 79 days of age. MAKE SURE YOU:   Understand these instructions.  Will watch your condition.  Will get help right away if you are not doing well or get worse. Document Released: 11/25/2005 Document Revised: 08/19/2012 Document Reviewed: 07/01/2012 [facility] Patient Information 2014 [place], [place].

## 2013-08-22 NOTE — Discharge Summary (Signed)
Obstetric Discharge Summary Reason for Admission: rupture of membranes Prenatal Procedures: ultrasound Intrapartum Procedures: spontaneous vaginal delivery Postpartum Procedures: none Complications-Operative and Postpartum: none Eating, drinking, voiding, ambulating well.  +flatus.  Lochia and pain wnl.  Denies dizziness, lightheadedness, or sob. No complaints.  Desires early d/c Hemoglobin  Date Value Range Status  08/20/2013 13.4  12.0 - 15.0 g/dL Final     HCT  Date Value Range Status  08/20/2013 39.0  36.0 - 46.0 % Final    Physical Exam:  General: alert, cooperative and no distress Lochia: appropriate Uterine Fundus: firm Incision: n/a DVT Evaluation: No evidence of DVT seen on physical exam. Negative Homan's sign. No cords or calf tenderness. No significant calf/ankle edema.  Discharge Diagnoses: Term Pregnancy-delivered  Discharge Information: Date: 08/22/2013 Activity: pelvic rest Diet: routine Medications: PNV and Ibuprofen Condition: stable Instructions: refer to practice specific booklet Discharge to: home Follow-up Information   Follow up with [facility] In 6 weeks. (for your postpartum visit)    Contact information:   [address] [place] [address] [phone]      Newborn Data: Live born female  Birth Weight: 8 lb 2.2 oz (3690 g) APGAR: 7, 8  Home with mother. Breast/bottlefeeding, condoms for contraception, OP circumcision  Marge Duncans 08/22/2013, 7:46 AM

## 2013-08-22 NOTE — Discharge Summary (Signed)

## 2013-08-23 NOTE — Discharge Summary (Signed)
Obstetric Discharge Summary Reason for Admission: rupture of membranes Prenatal Procedures: none Intrapartum Procedures: spontaneous vaginal delivery Postpartum Procedures: none Complications-Operative and Postpartum: none   Hemoglobin  Date Value Range Status  08/20/2013 13.4  12.0 - 15.0 g/dL Final     HCT  Date Value Range Status  08/20/2013 39.0  36.0 - 46.0 % Final    Physical Exam:  General: alert, cooperative and no distress Lochia: appropriate Uterine Fundus: firm Incision: n/a DVT Evaluation: Negative Homan's sign.  Calf/Ankle edema is present. Up to TT, 2+. No discoloration (Not significantly changed from edema during pregnancy) Heart: Sinus arrythmia, without MRG Lungs: No accessory muscle use. Lungs CTA.  Discharge Diagnoses: Term Pregnancy-delivered  Discharge Information: Date: 08/23/2013 Activity: unrestricted Diet: routine Medications: Ibuprofen and Percocet Condition: stable Instructions: refer to practice specific booklet Discharge to: home Follow-up Information   Follow up with [facility] In 6 weeks. (for your postpartum visit)    Contact information:   [address] [place] [address] [phone]      Joyce Schneider is a 22yo G1P1001 who presented to the MAU for ROM and progressed to SVD with epidural anestesia. 3 vessel cord, spontaneous placenta, EBL of without lacertaions. Mom is doing well with pain adequately controlled with Percocet and Ibuprofen. Bleeding is appropriate. She denies N, V, or dizziness, but has endorsed some intermittent mid-epigastric (non-exertional and without dyspnea, diaphoresis, increased work of breathing, or hemoptysis), drinking fluids relieves the pain. She plans on both breast and bottle feeding her newborn boy, who she will take for circumcision at an OP clinic. She politely refuses contraception use, despite education. She feels ready to return home.   Newborn Data: Live  born female  Birth Weight: 8 lb 2.2 oz (3690 g) APGAR: 7, 8  Home with mother.  Joyce Schneider 08/23/2013, 8:42 AM  I examined pt and agree with documentation above and PA-S plan of care. [facility]

## 2013-08-23 NOTE — Discharge Summary (Signed)
Attestation of Attending Supervision of Advanced Practitioner (CNM/NP): Evaluation and management procedures were performed by the Advanced Practitioner under my supervision and collaboration.  I have reviewed the Advanced Practitioner's note and chart, and I agree with the management and plan.  Johnika Escareno 08/23/2013 9:30 AM   

## 2013-08-23 NOTE — Progress Notes (Signed)
Ur chart review completed.  

## 2013-08-30 NOTE — H&P (Signed)
Attestation of Attending Supervision of Advanced Practitioner (CNM/NP): Evaluation and management procedures were performed by the Advanced Practitioner under my supervision and collaboration. I have reviewed the Advanced Practitioner's note and chart, and I agree with the management and plan.  Mariadelosang Wynns H. 1:57 PM

## 2013-08-31 ENCOUNTER — Encounter (HOSPITAL_COMMUNITY): Payer: Self-pay | Admitting: *Deleted

## 2013-08-31 ENCOUNTER — Inpatient Hospital Stay (HOSPITAL_COMMUNITY)
Admission: AD | Admit: 2013-08-31 | Discharge: 2013-09-01 | Disposition: A | Discharge status: Home / Self Care | Payer: Medicaid Other | Source: Ambulatory Visit | Attending: Obstetrics & Gynecology | Admitting: Obstetrics & Gynecology

## 2013-08-31 DIAGNOSIS — O99893 Other specified diseases and conditions complicating puerperium: Secondary | ICD-10-CM | POA: Insufficient documentation

## 2013-08-31 DIAGNOSIS — M940 Chondrocostal junction syndrome [Tietze]: Secondary | ICD-10-CM | POA: Insufficient documentation

## 2013-08-31 DIAGNOSIS — R079 Chest pain, unspecified: Secondary | ICD-10-CM | POA: Insufficient documentation

## 2013-08-31 NOTE — MAU Note (Signed)
Pt post vaginal delivery 9/13, having right side pain with inhalation that started 40 min ago.

## 2013-09-01 DIAGNOSIS — M940 Chondrocostal junction syndrome [Tietze]: Secondary | ICD-10-CM

## 2013-09-01 MED ORDER — IBUPROFEN 200 MG PO TABS: 800.0000 mg | ORAL_TABLET | Freq: Three times a day (TID) | ORAL | Status: DC

## 2013-09-01 MED ORDER — HYDROCODONE-ACETAMINOPHEN 5-325 MG PO TABS: 1.0000 | ORAL_TABLET | Freq: Four times a day (QID) | ORAL | Status: DC | PRN

## 2013-09-01 NOTE — MAU Provider Note (Signed)
Chief Complaint:  Chest Pain  Joyce Schneider is  22 y.o. G1P1001 who delivered NSVD on 08/21/2013. She presents complaining of Chest Pain  Onset is described as gradual and has been present for  3 hours. She states that since 2230 she has been having right sided rib pain that is worse with big deep breaths and movement. She says if she is very still she can make it go almost completely away. Worse laying down.   Still breastfeeding and lochia is less than a normal period.   OB History   Grav Para Term Preterm Abortions TAB SAB Ect Mult Living   1 1 1       1        Past Medical History  Diagnosis Date  . Medical history non-contributory     Past Surgical History  Procedure Laterality Date  . Wisdom tooth extraction      Family History  Problem Relation Age of Onset  . Hypertension Father     History  Substance Use Topics  . Smoking status: Never Smoker   . Smokeless tobacco: Not on file  . Alcohol Use: No    Allergies: No Known Allergies  Prescriptions prior to admission  Medication Sig Dispense Refill  . ibuprofen (ADVIL,MOTRIN) 600 MG tablet Take 1 tablet (600 mg total) by mouth every 6 (six) hours as needed for pain.  30 tablet  0  . Prenatal Vit-Fe Fumarate-FA (PRENATAL MULTIVITAMIN) TABS tablet Take 1 tablet by mouth every morning.         Review of Systems  Review of Systems  Constitutional: Negative for fever, chills, weight loss, malaise/fatigue and diaphoresis.  HENT: Negative for hearing loss, ear pain, nosebleeds, congestion, sore throat, neck pain, tinnitus and ear discharge.   Eyes: Negative for blurred vision, double vision, photophobia, pain, discharge and redness.  Respiratory: Negative for cough, hemoptysis, sputum production, shortness of breath, wheezing and stridor.   Cardiovascular: Negative for chest pain, palpitations, orthopnea,  leg swelling  Gastrointestinal: Negative for abdominal pain heartburn, nausea, vomiting, diarrhea, constipation,  blood in stool Genitourinary: Negative for dysuria, urgency, frequency, hematuria and flank pain.  Musculoskeletal: Negative for myalgias, back pain, joint pain and falls.  Skin: Negative for itching and rash.  Neurological: Negative for dizziness, tingling, tremors, sensory change, speech change, focal weakness, seizures, loss of consciousness, weakness and headaches.  Endo/Heme/Allergies: Negative for environmental allergies and polydipsia. Does not bruise/bleed easily.  Psychiatric/Behavioral: Negative for depression, suicidal ideas, hallucinations, memory loss and substance abuse. The patient is not nervous/anxious and does not have insomnia.      Physical Exam   Blood pressure 131/75, pulse 71, temperature 98 F (36.7 C), temperature source Oral, resp. rate 16, height 5\' 2"  (1.575 m), weight 74.662 kg (164 lb 9.6 oz), SpO2 99.00%, currently breastfeeding.  General: General appearance - alert, well appearing, and in no distress, oriented to person, place, and time and normal appearing weight Chest - clear to auscultation, no wheezes, rales or rhonchi, symmetric air entry, no tachypnea, retractions or cyanosis, chest wall tenderness noted on right rib cage Heart - normal rate, regular rhythm, normal S1, S2, no murmurs, rubs, clicks or gallops Abdomen - soft, nontender, nondistended, no masses or organomegaly no rebound tenderness noted Extremities - peripheral pulses normal, no pedal edema, no clubbing or cyanosis   Labs: No results found for this or any previous visit (from the past 24 hour(s)). Imaging Studies:  No results found.   Assessment: Costochondritis  Plan: Ibuprofen 800mg  PO  TID to decrease inflammation and Norco 5mg  PO q6 PRN severe pain - discussed that this can take weeks to resolve - continue ice/heat if helpful - no heavy lifting - may continue breastfeeding  FU at regular PP visit  Marissa Calamity, MD  I have seen and examined this patient and I agree  with the above. Cam Hai 7:21 AM 09/01/2013

## 2013-09-04 NOTE — MAU Provider Note (Signed)
Attestation of Attending Supervision of Advanced Practitioner (CNM/NP): Evaluation and management procedures were performed by the Advanced Practitioner under my supervision and collaboration. I have reviewed the Advanced Practitioner's note and chart, and I agree with the management and plan.  Essica Kiker H. 4:35 PM   

## 2013-10-21 ENCOUNTER — Ambulatory Visit (HOSPITAL_COMMUNITY)
Admission: RE | Admit: 2013-10-21 | Discharge: 2013-10-21 | Disposition: A | Discharge status: Home / Self Care | Payer: Medicaid Other | Source: Ambulatory Visit | Attending: Obstetrics & Gynecology | Admitting: Obstetrics & Gynecology

## 2013-10-21 NOTE — Lactation Note (Addendum)
Adult Lactation Consultation Outpatient Visit Note  Patient Name: Joyce Schneider Date of Birth: [birth year] Gestational Age at Delivery: Unknown Type of Delivery:   Infant DOB [birth year] Birth weight 8 lbs, 2 oz GA- 40 weeks  Breastfeeding History: Frequency of Breastfeeding: 9 feedings/day  Length of Feeding: 20 minutes Voids: 11 Stools: 6 (green/yellow seedy)  Supplementing / Method: not supplementing; exclusively breastfeeding Pumping: none  Type of Pump:   Frequency:  Volume:    Comments: Mom to see lactation for c/o "sharp shooting" pain on left breast; pain scale - "10" that began 2 weeks ago after breastfeedings and has progressively gotten worse.  Mom [facility] care [facility] department who referred her to [facility] for outpatient appointment..  Denies any nipple pain during feeding but reports is tender to touch.  No nipple damage noted upon observation but nipples did appear slightly pink and shiny at base of nipple.  Diaper rash noted on infant's buttocks that mom stated does not completely clear with regular diaper rash ointment.  Mom has been taking ibuprofen for pain.  Infant 27 weeks old.  Last peds visit @ 4 weeks and infant weighed 10 lbs, 2 oz.  Today's weight 12 lbs, 13.5 oz; Infant has gained slightly >1 oz per day since Peds visit one month ago.  Mom is exclusively breastfeeding and is not pumping additional milk during day.   Consultation Evaluation:  Initial Feeding Assessment: Pre-feed Weight: 12 obs, 13.5 oz. (5826 g) Post-feed Weight: 12 lbs, 15.3 (5878 g) Amount Transferred: 52 ml in 15 minutes of active feeding Comments:  Breast palpated prior to feeding and noted firm areas on mom's left breast; after feeding firm areas softened and no nodular areas noted.  No redness or streaks noted on breast.  Slight pink and shiny appearance at base of nipples (bilaterally).  Mom independently latched infant with depth and flanged lips; infant was very active and  squirming during feeding.  At end of feeding, nipples appeared normal (round and elongated) no pinching noted.  Mom reported sharp shooting pain beginning at base of breast.  Infant transferred 52 ml and was satisfied.  Mom reported she had just previously fed prior to visit.   Total Breast milk Transferred this Visit: 52 ml Total Supplement Given: none  Additional Interventions: "Patient Instructions for care of Thrush for Mother and Baby" sheet given along with instruction sheet for using Gentian Violet.  Suspect mom has a ductal infection d/t appearance of nipples, shooting pain reported by mom, and rash on infant as charted above  Recommend OB treating mom with both an antibiotic and antifungal d/t deep ductal symptoms along with assessment observations and Peds treating infant with both oral antifungal and antifungal cream for buttocks.   Follow-Up As needed     Lendon Ka 10/21/2013, 11:48 AM

## 2013-10-25 ENCOUNTER — Encounter: Payer: Medicaid Other | Admitting: Obstetrics and Gynecology

## 2013-10-25 ENCOUNTER — Ambulatory Visit (HOSPITAL_COMMUNITY): Payer: Medicaid Other

## 2014-01-05 ENCOUNTER — Other Ambulatory Visit: Payer: Self-pay

## 2014-01-05 ENCOUNTER — Other Ambulatory Visit: Payer: Self-pay | Admitting: Family Medicine

## 2014-01-05 DIAGNOSIS — N61 Mastitis without abscess: Secondary | ICD-10-CM

## 2014-01-05 DIAGNOSIS — N644 Mastodynia: Secondary | ICD-10-CM

## 2014-01-07 ENCOUNTER — Ambulatory Visit
Admission: RE | Admit: 2014-01-07 | Discharge: 2014-01-07 | Disposition: A | Discharge status: Home / Self Care | Payer: BC Managed Care – PPO | Source: Ambulatory Visit | Attending: Family Medicine | Admitting: Family Medicine

## 2014-01-07 DIAGNOSIS — N644 Mastodynia: Secondary | ICD-10-CM

## 2014-01-07 DIAGNOSIS — N61 Mastitis without abscess: Secondary | ICD-10-CM

## 2014-08-22 LAB — OB RESULTS CONSOLE HIV ANTIBODY (ROUTINE TESTING): HIV: NONREACTIVE

## 2014-08-22 LAB — OB RESULTS CONSOLE GC/CHLAMYDIA
CHLAMYDIA, DNA PROBE: NEGATIVE
Gonorrhea: NEGATIVE

## 2014-10-10 ENCOUNTER — Encounter (HOSPITAL_COMMUNITY): Payer: Self-pay | Admitting: *Deleted

## 2014-12-09 NOTE — L&D Delivery Note (Signed)
Patient was C/C/+2 and pushed for <4620minutes with epidural.   NSVD female infant, Apgars 7/9, weight pending.   FHT found to decrease to 80's x753min, advised mother recommended vacuum assist to expedite delivery. Vac cup easily applied at +3 station, one pull no pop off effected delivery of head.  Gentle downward traction insufficient for delivery of anterior shoulder, Lysle DingwallMc Roberts and delivery of posterior arm relieved should dystocia <30sec.  Baby was stunned but shortly began crying and moved both arms vigorously. The patient had no lacerations. Fundus was firm. EBL was expected amount. Placenta was delivered intact. Vagina was clear.  Baby was vigorous and doing skin to skin with mother.  Philip AspenALLAHAN, Kelsie Kramp

## 2015-01-03 LAB — OB RESULTS CONSOLE GBS: GBS: NEGATIVE

## 2015-01-28 ENCOUNTER — Inpatient Hospital Stay (HOSPITAL_COMMUNITY)
Admission: AD | Admit: 2015-01-28 | Discharge: 2015-01-30 | DRG: 775 | Disposition: A | Discharge status: Home / Self Care | Payer: BLUE CROSS/BLUE SHIELD | Source: Ambulatory Visit | Attending: Obstetrics and Gynecology | Admitting: Obstetrics and Gynecology

## 2015-01-28 ENCOUNTER — Encounter (HOSPITAL_COMMUNITY): Payer: Self-pay | Admitting: *Deleted

## 2015-01-28 DIAGNOSIS — O4290 Premature rupture of membranes, unspecified as to length of time between rupture and onset of labor, unspecified weeks of gestation: Secondary | ICD-10-CM | POA: Diagnosis present

## 2015-01-28 DIAGNOSIS — Z3A38 38 weeks gestation of pregnancy: Secondary | ICD-10-CM | POA: Diagnosis present

## 2015-01-28 DIAGNOSIS — Z3483 Encounter for supervision of other normal pregnancy, third trimester: Secondary | ICD-10-CM | POA: Diagnosis present

## 2015-01-28 DIAGNOSIS — O4200 Premature rupture of membranes, onset of labor within 24 hours of rupture, unspecified weeks of gestation: Secondary | ICD-10-CM

## 2015-01-28 DIAGNOSIS — R0789 Other chest pain: Secondary | ICD-10-CM

## 2015-01-28 HISTORY — DX: Depression, unspecified: F32.A

## 2015-01-28 HISTORY — DX: Major depressive disorder, single episode, unspecified: F32.9

## 2015-01-28 HISTORY — DX: Gastro-esophageal reflux disease without esophagitis: K21.9

## 2015-01-28 LAB — CBC
HCT: 38.8 % (ref 36.0–46.0)
Hemoglobin: 12.8 g/dL (ref 12.0–15.0)
MCH: 27.1 pg (ref 26.0–34.0)
MCHC: 33 g/dL (ref 30.0–36.0)
MCV: 82.2 fL (ref 78.0–100.0)
Platelets: 163 10*3/uL (ref 150–400)
RBC: 4.72 MIL/uL (ref 3.87–5.11)
RDW: 16.1 % — ABNORMAL HIGH (ref 11.5–15.5)
WBC: 9.3 10*3/uL (ref 4.0–10.5)

## 2015-01-28 LAB — POCT FERN TEST: POCT Fern Test: POSITIVE

## 2015-01-28 MED ORDER — ACETAMINOPHEN 325 MG PO TABS
650.0000 mg | ORAL_TABLET | ORAL | Status: DC | PRN
  Administered 2015-01-29: 650 mg via ORAL
  Filled 2015-01-28: qty 2

## 2015-01-28 MED ORDER — OXYTOCIN 40 UNITS IN LACTATED RINGERS INFUSION - SIMPLE MED
1.0000 m[IU]/min | INTRAVENOUS | Status: DC
  Administered 2015-01-28: 2 m[IU]/min via INTRAVENOUS
  Filled 2015-01-28: qty 1000

## 2015-01-28 MED ORDER — ONDANSETRON HCL 4 MG/2ML IJ SOLN: 4.0000 mg | Freq: Four times a day (QID) | INTRAMUSCULAR | Status: DC | PRN

## 2015-01-28 MED ORDER — OXYTOCIN BOLUS FROM INFUSION
500.0000 mL | INTRAVENOUS | Status: DC
  Administered 2015-01-29: 500 mL via INTRAVENOUS

## 2015-01-28 MED ORDER — CITRIC ACID-SODIUM CITRATE 334-500 MG/5ML PO SOLN: 30.0000 mL | ORAL | Status: DC | PRN

## 2015-01-28 MED ORDER — OXYCODONE-ACETAMINOPHEN 5-325 MG PO TABS: 2.0000 | ORAL_TABLET | ORAL | Status: DC | PRN

## 2015-01-28 MED ORDER — TERBUTALINE SULFATE 1 MG/ML IJ SOLN: 0.2500 mg | Freq: Once | INTRAMUSCULAR | Status: AC | PRN

## 2015-01-28 MED ORDER — LACTATED RINGERS IV SOLN
INTRAVENOUS | Status: DC
  Administered 2015-01-28: 23:00:00 via INTRAVENOUS

## 2015-01-28 MED ORDER — LIDOCAINE HCL (PF) 1 % IJ SOLN
30.0000 mL | INTRAMUSCULAR | Status: DC | PRN
  Administered 2015-01-29: 30 mL via SUBCUTANEOUS
  Filled 2015-01-28: qty 30

## 2015-01-28 MED ORDER — LACTATED RINGERS IV SOLN: 500.0000 mL | INTRAVENOUS | Status: DC | PRN

## 2015-01-28 MED ORDER — OXYTOCIN 40 UNITS IN LACTATED RINGERS INFUSION - SIMPLE MED: 62.5000 mL/h | INTRAVENOUS | Status: DC

## 2015-01-28 MED ORDER — OXYCODONE-ACETAMINOPHEN 5-325 MG PO TABS: 1.0000 | ORAL_TABLET | ORAL | Status: DC | PRN

## 2015-01-28 NOTE — OB Triage Note (Signed)
Patient states water broke around 8:45pm.

## 2015-01-28 NOTE — Progress Notes (Signed)
[facility] via w/c from Triage to further eval SROM. Fld on pad in triage is yellow-green

## 2015-01-28 NOTE — MAU Note (Signed)
Leaking fld for the last . Alittle crampy. 3cm last ck

## 2015-01-28 NOTE — H&P (Signed)
24 y.o. 525w5d  G2P1001 comes in c/o SROM.  Otherwise has good fetal movement and no bleeding.  Past Medical History  Diagnosis Date  . Medical history non-contributory     Past Surgical History  Procedure Laterality Date  . Wisdom tooth extraction      OB History  Gravida Para Term Preterm AB SAB TAB Ectopic Multiple Living  2 1 1       1     # Outcome Date GA Lbr Len/2nd Weight Sex Delivery Anes PTL Lv  2 Current           1 Term 08/21/13 251w0d 15:35 / 01:30 3.69 kg (8 lb 2.2 oz) M Vag-Spont EPI  Y      History   Social History  . Marital Status: Married    Spouse Name: N/A  . Number of Children: N/A  . Years of Education: N/A   Occupational History  . Not on file.   Social History Main Topics  . Smoking status: Never Smoker   . Smokeless tobacco: Not on file  . Alcohol Use: No  . Drug Use: No  . Sexual Activity: Yes    Birth Control/ Protection: None   Other Topics Concern  . Not on file   Social History Narrative   Review of patient's allergies indicates no known allergies.    Prenatal Transfer Tool  Maternal Diabetes: No Genetic Screening: Normal Maternal Ultrasounds/Referrals: Normal Fetal Ultrasounds or other Referrals:  None Maternal Substance Abuse:  No Significant Maternal Medications:  None Significant Maternal Lab Results: Lab values include: Group B Strep negative  Other PNC: uncomplicated.    Filed Vitals:   01/28/15 2131  BP: 136/78  Pulse: 103  Temp:   Resp:      Lungs/Cor:  NAD Abdomen:  soft, gravid Ex:  no cords, erythema SVE:  Pending assessment FHTs:  125 by doppler Toco:  Pending assessment   A/P   Admit with SROM, possible meconium  GBS neg  Epidural if desired  Routine care   TiptonALLAHAN, Luther ParodySIDNEY

## 2015-01-29 ENCOUNTER — Encounter (HOSPITAL_COMMUNITY): Payer: Self-pay | Admitting: Anesthesiology

## 2015-01-29 ENCOUNTER — Inpatient Hospital Stay (HOSPITAL_COMMUNITY): Payer: BLUE CROSS/BLUE SHIELD | Admitting: Anesthesiology

## 2015-01-29 LAB — CBC
HCT: 36.2 % (ref 36.0–46.0)
Hemoglobin: 12.2 g/dL (ref 12.0–15.0)
MCH: 27.9 pg (ref 26.0–34.0)
MCHC: 33.7 g/dL (ref 30.0–36.0)
MCV: 82.6 fL (ref 78.0–100.0)
PLATELETS: 147 10*3/uL — AB (ref 150–400)
RBC: 4.38 MIL/uL (ref 3.87–5.11)
RDW: 16.2 % — ABNORMAL HIGH (ref 11.5–15.5)
WBC: 16.9 10*3/uL — ABNORMAL HIGH (ref 4.0–10.5)

## 2015-01-29 LAB — TYPE AND SCREEN
ABO/RH(D): A POS
Antibody Screen: NEGATIVE

## 2015-01-29 MED ORDER — BENZOCAINE-MENTHOL 20-0.5 % EX AERO
1.0000 "application " | INHALATION_SPRAY | CUTANEOUS | Status: DC | PRN
  Administered 2015-01-29: 1 via TOPICAL
  Filled 2015-01-29: qty 56

## 2015-01-29 MED ORDER — ZOLPIDEM TARTRATE 5 MG PO TABS: 5.0000 mg | ORAL_TABLET | Freq: Every evening | ORAL | Status: DC | PRN

## 2015-01-29 MED ORDER — EPHEDRINE 5 MG/ML INJ
10.0000 mg | INTRAVENOUS | Status: DC | PRN
  Filled 2015-01-29: qty 2

## 2015-01-29 MED ORDER — LACTATED RINGERS IV SOLN: 500.0000 mL | Freq: Once | INTRAVENOUS | Status: DC

## 2015-01-29 MED ORDER — TETANUS-DIPHTH-ACELL PERTUSSIS 5-2.5-18.5 LF-MCG/0.5 IM SUSP
0.5000 mL | Freq: Once | INTRAMUSCULAR | Status: AC
Start: 2015-01-30 — End: 2015-01-29
  Administered 2015-01-29: 0.5 mL via INTRAMUSCULAR
  Filled 2015-01-29: qty 0.5

## 2015-01-29 MED ORDER — WITCH HAZEL-GLYCERIN EX PADS: 1.0000 "application " | MEDICATED_PAD | CUTANEOUS | Status: DC | PRN

## 2015-01-29 MED ORDER — SIMETHICONE 80 MG PO CHEW: 80.0000 mg | CHEWABLE_TABLET | ORAL | Status: DC | PRN

## 2015-01-29 MED ORDER — IBUPROFEN 600 MG PO TABS
600.0000 mg | ORAL_TABLET | Freq: Four times a day (QID) | ORAL | Status: DC
  Administered 2015-01-29 – 2015-01-30 (×4): 600 mg via ORAL
  Filled 2015-01-29 (×5): qty 1

## 2015-01-29 MED ORDER — OXYCODONE-ACETAMINOPHEN 5-325 MG PO TABS: 1.0000 | ORAL_TABLET | ORAL | Status: DC | PRN

## 2015-01-29 MED ORDER — PRENATAL MULTIVITAMIN CH
1.0000 | ORAL_TABLET | Freq: Every day | ORAL | Status: DC
  Administered 2015-01-29 – 2015-01-30 (×2): 1 via ORAL
  Filled 2015-01-29 (×2): qty 1

## 2015-01-29 MED ORDER — ONDANSETRON HCL 4 MG PO TABS
4.0000 mg | ORAL_TABLET | ORAL | Status: DC | PRN
Start: 2015-01-29 — End: 2015-01-30

## 2015-01-29 MED ORDER — DIPHENHYDRAMINE HCL 25 MG PO CAPS: 25.0000 mg | ORAL_CAPSULE | Freq: Four times a day (QID) | ORAL | Status: DC | PRN

## 2015-01-29 MED ORDER — SENNOSIDES-DOCUSATE SODIUM 8.6-50 MG PO TABS
2.0000 | ORAL_TABLET | ORAL | Status: DC
  Administered 2015-01-30: 2 via ORAL
  Filled 2015-01-29: qty 2

## 2015-01-29 MED ORDER — ONDANSETRON HCL 4 MG/2ML IJ SOLN: 4.0000 mg | INTRAMUSCULAR | Status: DC | PRN

## 2015-01-29 MED ORDER — PHENYLEPHRINE 40 MCG/ML (10ML) SYRINGE FOR IV PUSH (FOR BLOOD PRESSURE SUPPORT)
80.0000 ug | PREFILLED_SYRINGE | INTRAVENOUS | Status: DC | PRN
  Filled 2015-01-29: qty 2
  Filled 2015-01-29: qty 20

## 2015-01-29 MED ORDER — LANOLIN HYDROUS EX OINT: TOPICAL_OINTMENT | CUTANEOUS | Status: DC | PRN

## 2015-01-29 MED ORDER — LIDOCAINE HCL (PF) 1 % IJ SOLN
INTRAMUSCULAR | Status: DC | PRN
  Administered 2015-01-29 (×2): 8 mL

## 2015-01-29 MED ORDER — DIBUCAINE 1 % RE OINT: 1.0000 "application " | TOPICAL_OINTMENT | RECTAL | Status: DC | PRN

## 2015-01-29 MED ORDER — FENTANYL 2.5 MCG/ML BUPIVACAINE 1/10 % EPIDURAL INFUSION (WH - ANES)
INTRAMUSCULAR | Status: DC | PRN
  Administered 2015-01-29: 14 mL/h via EPIDURAL

## 2015-01-29 MED ORDER — DIPHENHYDRAMINE HCL 50 MG/ML IJ SOLN: 12.5000 mg | INTRAMUSCULAR | Status: DC | PRN

## 2015-01-29 MED ORDER — OXYCODONE-ACETAMINOPHEN 5-325 MG PO TABS: 2.0000 | ORAL_TABLET | ORAL | Status: DC | PRN

## 2015-01-29 MED ORDER — PHENYLEPHRINE 40 MCG/ML (10ML) SYRINGE FOR IV PUSH (FOR BLOOD PRESSURE SUPPORT)
80.0000 ug | PREFILLED_SYRINGE | INTRAVENOUS | Status: DC | PRN
  Filled 2015-01-29: qty 2

## 2015-01-29 MED ORDER — FENTANYL 2.5 MCG/ML BUPIVACAINE 1/10 % EPIDURAL INFUSION (WH - ANES)
14.0000 mL/h | INTRAMUSCULAR | Status: DC | PRN
  Administered 2015-01-29: 14 mL/h via EPIDURAL
  Filled 2015-01-29: qty 125

## 2015-01-29 NOTE — Anesthesia Preprocedure Evaluation (Signed)
Anesthesia Evaluation  Patient identified by MRN, date of birth, ID band Patient awake    Reviewed: Allergy & Precautions, H&P , NPO status , Patient's Chart, lab work & pertinent test results  Airway Mallampati: II  TM Distance: >3 FB Neck ROM: full    Dental no notable dental hx.    Pulmonary neg pulmonary ROS,    Pulmonary exam normal       Cardiovascular negative cardio ROS      Neuro/Psych negative neurological ROS     GI/Hepatic Neg liver ROS,   Endo/Other  negative endocrine ROS  Renal/GU negative Renal ROS     Musculoskeletal   Abdominal Normal abdominal exam  (+)   Peds  Hematology negative hematology ROS (+)   Anesthesia Other Findings   Reproductive/Obstetrics (+) Pregnancy                             Anesthesia Physical Anesthesia Plan  ASA: II  Anesthesia Plan: Epidural   Post-op Pain Management:    Induction:   Airway Management Planned:   Additional Equipment:   Intra-op Plan:   Post-operative Plan:   Informed Consent: I have reviewed the patients History and Physical, chart, labs and discussed the procedure including the risks, benefits and alternatives for the proposed anesthesia with the patient or authorized representative who has indicated his/her understanding and acceptance.     Plan Discussed with:   Anesthesia Plan Comments:         Anesthesia Quick Evaluation

## 2015-01-29 NOTE — Lactation Note (Signed)
This note was copied from the chart of Boy Zenaida NieceKarina Kalmar. Lactation Consultation Note: Initial visit with this experienced BF mom. She is trying to latch baby when I went in. Reports baby has latched but goes off to sleep after a few minutes.  Mom easily able to hand express Colostrum. Reports breast feeding was going well with her first baby but then at 5-6 months she began having lots of pain through the breast which the MD's couldn't determine cause and she quit BF. BF brochure given with resources for support after DC. No questions at present. To call for assist prn  Patient Name: [name]'s Date: 01/29/2015 Reason for consult: Initial assessment   Maternal Data Formula Feeding for Exclusion: No Has patient been taught Hand Expression?: Yes Does the patient have breastfeeding experience prior to this delivery?: Yes  Feeding Feeding Type: Breast Fed Length of feed: 5 min  LATCH Score/Interventions Latch: Repeated attempts needed to sustain latch, nipple held in mouth throughout feeding, stimulation needed to elicit sucking reflex.  Audible Swallowing: None  Type of Nipple: Everted at rest and after stimulation  Comfort (Breast/Nipple): Soft / non-tender     Hold (Positioning): No assistance needed to correctly position infant at breast.  LATCH Score: 7  Lactation Tools Discussed/Used     Consult Status Consult Status: Follow-up Date: 01/30/15 Follow-up type: In-patient    Pamelia HoitWeeks, Marilou Barnfield D 01/29/2015, 3:11 PM

## 2015-01-29 NOTE — Anesthesia Procedure Notes (Signed)
Epidural Patient location during procedure: OB Start time: 01/29/2015 1:44 AM End time: 01/29/2015 1:48 AM  Staffing Anesthesiologist: Leilani AbleHATCHETT, Emillie Chasen Performed by: anesthesiologist   Preanesthetic Checklist Completed: patient identified, surgical consent, pre-op evaluation, timeout performed, IV checked, risks and benefits discussed and monitors and equipment checked  Epidural Patient position: sitting Prep: site prepped and draped and DuraPrep Patient monitoring: continuous pulse ox and blood pressure Approach: midline Location: L3-L4 Injection technique: LOR air  Needle:  Needle type: Tuohy  Needle gauge: 17 G Needle length: 9 cm and 9 Needle insertion depth: 6 cm Catheter type: closed end flexible Catheter size: 19 Gauge Catheter at skin depth: 11 cm Test dose: negative and Other  Assessment Sensory level: T9 Events: blood not aspirated, injection not painful, no injection resistance, negative IV test and no paresthesia  Additional Notes Reason for block:procedure for pain

## 2015-01-29 NOTE — Anesthesia Postprocedure Evaluation (Signed)
  Anesthesia Post-op Note  Patient: Joyce Schneider  Procedure(s) Performed: * No procedures listed *  Patient Location: PACU and Mother/Baby  Anesthesia Type:Epidural  Level of Consciousness: awake, alert  and oriented  Airway and Oxygen Therapy: Patient Spontanous Breathing  Post-op Pain: mild  Post-op Assessment: Post-op Vital signs reviewed, Patient's Cardiovascular Status Stable, Respiratory Function Stable, No signs of Nausea or vomiting, Adequate PO intake, Pain level controlled, No headache, No backache, No residual numbness and No residual motor weakness  Post-op Vital Signs: Reviewed and stable  Last Vitals:  Filed Vitals:   01/29/15 0700  BP: 131/61  Pulse: 114  Temp: 36.7 C  Resp: 20    Complications: No apparent anesthesia complications

## 2015-01-29 NOTE — Progress Notes (Signed)
Clinical Social Work Department PSYCHOSOCIAL ASSESSMENT - MATERNAL/CHILD 01/29/2015  Patient:  Joyce Schneider, Joyce Schneider  Account Number:  [id number]  [place] Date:  01/28/2015  Ardine Eng Name:   Mikael Spray    Clinical Social Worker:  Almira Phetteplace, LCSW   Date/Time:  01/29/2015 03:30 PM  Date Referred:  01/28/2015   Referral source  Central Nursery     Other referral source:    I:  FAMILY / HOME ENVIRONMENT Child's legal guardian:  PARENT  Guardian - Name Guardian - Age Guardian - Address  [place],[address], [place] [address]  Wylene Simmer  same as above   Other household support members/support persons Other support:   Extensive family support    II  PSYCHOSOCIAL DATA Information Source:    Occupational hygienist Employment:   Spouse is employed   Museum/gallery curator resources:  Multimedia programmer If [employer] / Grade:   Maternity Care Coordinator / Child Services Coordination / Early Interventions:  Cultural issues impacting care:    III  STRENGTHS Strengths  Supportive family/friends  Home prepared for Child (including basic supplies)  Adequate Resources   Strength comment:    IV  RISK FACTORS AND CURRENT PROBLEMS Current Problem:     Risk Factor & Current Problem Patient Issue Family Issue Risk Factor / Current Problem Comment   N N     V  SOCIAL WORK ASSESSMENT Acknowledged order for social work consult to assess mother's hx of PP Depression.  Met with both parents.  They were pleasant and receptive to CSW.  There were several visitor in the room and mother agreed to meet with CSW while her visitors were present.  Parents are married and have one other dependent age 29 months.   Mother reports hx of PP Depression with first pregnancy.  Informed that she was prescribe medication which was very effective.  Mother states that she will not hesitate to seek treatment again if needed.    She denies any  current symptoms of depression or anxiety.    She denies any hx of substance abuse.  No acute social concerns related at this time.  Informed parents of CSW availability.      VI SOCIAL WORK PLAN Social Work Plan  No Further Intervention Required / No Barriers to Discharge

## 2015-01-29 NOTE — Progress Notes (Signed)
Patient is eating, ambulating, voiding.  Pain control is good.  Appropriate lochia.  No complaints.  Filed Vitals:   01/29/15 0515 01/29/15 0530 01/29/15 0615 01/29/15 0700  BP: 117/67 92/61 130/62 131/61  Pulse: 99 100 100 114  Temp:   99.2 F (37.3 C) 98 F (36.7 C)  TempSrc:   Oral Oral  Resp:   20 20  Height:      Weight:        Fundus firm Perineum without swelling. Ext; no CT  Lab Results  Component Value Date   WBC 16.9* 01/29/2015   HGB 12.2 01/29/2015   HCT 36.2 01/29/2015   MCV 82.6 01/29/2015   PLT 147* 01/29/2015    --/--/A POS (02/20 2245)  A/P Post partum day 0 s/p VAVD. Occ. Mild range BP, pt asymptomatic. Circ is declined.  Routine care.    Philip AspenALLAHAN, Joyce Schneider

## 2015-01-30 ENCOUNTER — Inpatient Hospital Stay (HOSPITAL_COMMUNITY): Payer: BLUE CROSS/BLUE SHIELD

## 2015-01-30 LAB — COMPREHENSIVE METABOLIC PANEL
ALT: 19 U/L (ref 0–35)
ANION GAP: 2 — AB (ref 5–15)
AST: 29 U/L (ref 0–37)
Albumin: 2.6 g/dL — ABNORMAL LOW (ref 3.5–5.2)
Alkaline Phosphatase: 120 U/L — ABNORMAL HIGH (ref 39–117)
BUN: 11 mg/dL (ref 6–23)
CALCIUM: 8.4 mg/dL (ref 8.4–10.5)
CO2: 21 mmol/L (ref 19–32)
Chloride: 114 mmol/L — ABNORMAL HIGH (ref 96–112)
Creatinine, Ser: 0.55 mg/dL (ref 0.50–1.10)
GFR calc Af Amer: >90 mL/min (ref >90)
GFR calc non Af Amer: >90 mL/min (ref >90)
GLUCOSE: 117 mg/dL — AB (ref 70–99)
Potassium: 4 mmol/L (ref 3.5–5.1)
Sodium: 137 mmol/L (ref 135–145)
TOTAL PROTEIN: 5.5 g/dL — AB (ref 6.0–8.3)
Total Bilirubin: 0.2 mg/dL — ABNORMAL LOW (ref 0.3–1.2)

## 2015-01-30 LAB — CBC
HCT: 35.2 % — ABNORMAL LOW (ref 36.0–46.0)
Hemoglobin: 11.6 g/dL — ABNORMAL LOW (ref 12.0–15.0)
MCH: 27.7 pg (ref 26.0–34.0)
MCHC: 33 g/dL (ref 30.0–36.0)
MCV: 84 fL (ref 78.0–100.0)
PLATELETS: 157 10*3/uL (ref 150–400)
RBC: 4.19 MIL/uL (ref 3.87–5.11)
RDW: 16.7 % — AB (ref 11.5–15.5)
WBC: 9.8 10*3/uL (ref 4.0–10.5)

## 2015-01-30 LAB — TROPONIN I: Troponin I: <0.03 ng/mL (ref <0.031)

## 2015-01-30 LAB — HIV ANTIBODY (ROUTINE TESTING W REFLEX): HIV SCREEN 4TH GENERATION: NONREACTIVE

## 2015-01-30 LAB — RPR: RPR: NONREACTIVE

## 2015-01-30 MED ORDER — SERTRALINE HCL 25 MG PO TABS: ORAL_TABLET | ORAL | Status: DC

## 2015-01-30 MED ORDER — DOCUSATE SODIUM 100 MG PO CAPS: 100.0000 mg | ORAL_CAPSULE | Freq: Two times a day (BID) | ORAL | Status: DC

## 2015-01-30 MED ORDER — IBUPROFEN 600 MG PO TABS: 600.0000 mg | ORAL_TABLET | Freq: Four times a day (QID) | ORAL | Status: DC | PRN

## 2015-01-30 MED ORDER — FAMOTIDINE 20 MG PO TABS
40.0000 mg | ORAL_TABLET | Freq: Every day | ORAL | Status: DC
  Administered 2015-01-30: 40 mg via ORAL
  Filled 2015-01-30: qty 2

## 2015-01-30 MED ORDER — OXYCODONE-ACETAMINOPHEN 5-325 MG PO TABS: 2.0000 | ORAL_TABLET | ORAL | Status: DC | PRN

## 2015-01-30 NOTE — Progress Notes (Signed)
The previous progress note I made was meant for 0930 this am.

## 2015-01-30 NOTE — Progress Notes (Signed)
Post Partum Day 1 Subjective: C/O chest pressure when she is standing. Does not seem to occur when sitting or laying flat. Started last night. Denies SOB, chest pain, diaphoresis, lightheadedness. No radiation. Bleeding appropriate. Breast feeding going well  Objective: Blood pressure 128/72, pulse 96, temperature 98.2 F (36.8 C), temperature source Oral, resp. rate 18, height 5\' 2"  (1.575 m), weight 90.266 kg (199 lb), SpO2 100 %, unknown if currently breastfeeding.  Vitals as above (taken while in the rm)  Physical Exam:  General: alert, cooperative and appears stated age  Lungs: CTAB Heart: RRR, no M/G/R Lochia: appropriate Uterine Fundus: firm DVT Evaluation: No evidence of DVT seen on physical exam. Negative Homan's sign. No cords or calf tenderness. Calf/Ankle edema is present.   Recent Labs  01/28/15 2245 01/29/15 0642  HGB 12.8 12.2  HCT 38.8 36.2    Assessment/Plan: 1) Chest Pressure: uncertain etiology: will obtain 12 Lead EKG and administer pepcid 40mg  po x 1. If EKG WNL and pepcid does not improve chest tightness then may need to consider LE dopplers and additional blood work. If pepcid resolves chest pressure and EKG WNL then pt would like D/C home   LOS: 2 days   Shlomo Seres H. 01/30/2015, 9:18 AM

## 2015-01-30 NOTE — Progress Notes (Signed)
  S: Pt with persistent chest pressure but now radiating down toward abdomen. O:  Filed Vitals:   01/30/15 0910  BP: 128/72  Pulse: 96  Temp:   Resp: 18   EKG: NSR, left axis deviation, possible anterior infarct, age undetermined EKG compared with patients prior EKG from 05/2013 and essentially unchanged  Will check CBC/CMP/troponin I and CXR LE dopplers may be of less utility

## 2015-01-30 NOTE — Progress Notes (Signed)
Patient reports chest pressure upon standing. MD present at the time. Vital signs obtained. MD orders completed. Will continue to monitor.

## 2015-02-08 NOTE — Discharge Summary (Signed)
Obstetric Discharge Summary Reason for Admission: onset of labor Prenatal Procedures: ultrasound Intrapartum Procedures: spontaneous vaginal delivery Postpartum Procedures: none Complications-Operative and Postpartum: Chest pressure-EKG, CXR, Blood work all normal HEMOGLOBIN  Date Value Ref Range Status  01/30/2015 11.6* 12.0 - 15.0 g/dL Final   HCT  Date Value Ref Range Status  01/30/2015 35.2* 36.0 - 46.0 % Final    Physical Exam:  General: alert, cooperative and appears stated age Lochia: appropriate Uterine Fundus: firm DVT Evaluation: No evidence of DVT seen on physical exam. Negative Homan's sign. No cords or calf tenderness.  Discharge Diagnoses: Term Pregnancy-delivered  Discharge Information: Date: 02/08/2015 Activity: pelvic rest Diet: routine Medications: Ibuprofen, Colace and Percocet Condition: improved Instructions: refer to practice specific booklet Discharge to: home   Newborn Data: Live born female  Birth Weight: 8 lb 7.5 oz (3840 g) APGAR: 7, 9  Home with mother.  Joyce Schneider H. 02/08/2015, 8:53 PM

## 2018-01-30 ENCOUNTER — Other Ambulatory Visit: Payer: Self-pay | Admitting: Physician Assistant

## 2018-01-30 ENCOUNTER — Other Ambulatory Visit (HOSPITAL_COMMUNITY)
Admission: RE | Admit: 2018-01-30 | Discharge: 2018-01-30 | Disposition: A | Discharge status: Home / Self Care | Payer: 59 | Source: Ambulatory Visit | Attending: Family Medicine | Admitting: Family Medicine

## 2018-01-30 DIAGNOSIS — Z124 Encounter for screening for malignant neoplasm of cervix: Secondary | ICD-10-CM | POA: Diagnosis not present

## 2018-01-30 DIAGNOSIS — Z01419 Encounter for gynecological examination (general) (routine) without abnormal findings: Secondary | ICD-10-CM | POA: Insufficient documentation

## 2018-01-30 DIAGNOSIS — Z0000 Encounter for general adult medical examination without abnormal findings: Secondary | ICD-10-CM | POA: Diagnosis not present

## 2018-02-02 LAB — CYTOLOGY - PAP: Diagnosis: NEGATIVE

## 2018-05-18 DIAGNOSIS — Z3481 Encounter for supervision of other normal pregnancy, first trimester: Secondary | ICD-10-CM | POA: Diagnosis not present

## 2018-05-18 LAB — OB RESULTS CONSOLE ABO/RH: RH Type: POSITIVE

## 2018-05-18 LAB — OB RESULTS CONSOLE RUBELLA ANTIBODY, IGM: Rubella: IMMUNE

## 2018-05-18 LAB — OB RESULTS CONSOLE ANTIBODY SCREEN: ANTIBODY SCREEN: NEGATIVE

## 2018-05-18 LAB — OB RESULTS CONSOLE HEPATITIS B SURFACE ANTIGEN: Hepatitis B Surface Ag: NEGATIVE

## 2018-05-18 LAB — OB RESULTS CONSOLE HIV ANTIBODY (ROUTINE TESTING): HIV: NONREACTIVE

## 2018-05-18 LAB — OB RESULTS CONSOLE RPR: RPR: NONREACTIVE

## 2018-05-19 DIAGNOSIS — O26851 Spotting complicating pregnancy, first trimester: Secondary | ICD-10-CM | POA: Diagnosis not present

## 2018-05-19 DIAGNOSIS — O26899 Other specified pregnancy related conditions, unspecified trimester: Secondary | ICD-10-CM | POA: Diagnosis not present

## 2018-07-18 ENCOUNTER — Ambulatory Visit (INDEPENDENT_AMBULATORY_CARE_PROVIDER_SITE_OTHER): Payer: 59 | Admitting: Podiatry

## 2018-07-18 DIAGNOSIS — B351 Tinea unguium: Secondary | ICD-10-CM

## 2018-07-21 NOTE — Progress Notes (Signed)
   Subjective: 27 year old female presenting today as a new patient with a chief complaint of thickened, discolored nails of the bilateral great toes that began several months ago. She has not done anything for treatment. She denies modifying factors. She notes she is currently [redacted] weeks pregnant. Patient is here for further evaluation and treatment.   Past Medical History:  Diagnosis Date  . Depression    pp depression  . GERD (gastroesophageal reflux disease)     Objective: Physical Exam General: The patient is alert and oriented x3 in no acute distress.  Dermatology: Hyperkeratotic, discolored, thickened, onychodystrophy of bilateral great toenails noted. Skin is warm, dry and supple bilateral lower extremities. Negative for open lesions or macerations.  Vascular: Palpable pedal pulses bilaterally. No edema or erythema noted. Capillary refill within normal limits.  Neurological: Epicritic and protective threshold grossly intact bilaterally.   Musculoskeletal Exam: Range of motion within normal limits to all pedal and ankle joints bilateral. Muscle strength 5/5 in all groups bilateral.   Assessment: #1 onychomycosis bilateral great toenails   Plan of Care:  #1 Patient was evaluated. #2 Do not recommend oral Lamisil since patient is pregnant.  #3 Recommended topical Formula 3 antifungal daily.  #4 Patient may benefit form laser treatment in the future.  #5 Return to clinic as needed.    Felecia ShellingBrent M. Evans, DPM [facility]  Dr. Felecia ShellingBrent M. Evans, DPM    [address]                                        [place], [place] [address]                Office [phone]  Fax [phone]

## 2018-08-07 DIAGNOSIS — Z3482 Encounter for supervision of other normal pregnancy, second trimester: Secondary | ICD-10-CM | POA: Diagnosis not present

## 2018-08-31 DIAGNOSIS — Z3482 Encounter for supervision of other normal pregnancy, second trimester: Secondary | ICD-10-CM | POA: Diagnosis not present

## 2018-08-31 DIAGNOSIS — Z23 Encounter for immunization: Secondary | ICD-10-CM | POA: Diagnosis not present

## 2018-10-09 DIAGNOSIS — Z3482 Encounter for supervision of other normal pregnancy, second trimester: Secondary | ICD-10-CM | POA: Diagnosis not present

## 2018-10-23 DIAGNOSIS — Z23 Encounter for immunization: Secondary | ICD-10-CM | POA: Diagnosis not present

## 2018-10-27 ENCOUNTER — Other Ambulatory Visit (HOSPITAL_COMMUNITY): Payer: Self-pay | Admitting: Obstetrics & Gynecology

## 2018-10-27 ENCOUNTER — Encounter (HOSPITAL_COMMUNITY): Payer: Self-pay

## 2018-10-27 DIAGNOSIS — Z363 Encounter for antenatal screening for malformations: Secondary | ICD-10-CM

## 2018-10-27 DIAGNOSIS — O283 Abnormal ultrasonic finding on antenatal screening of mother: Secondary | ICD-10-CM

## 2018-10-27 DIAGNOSIS — Z3A29 29 weeks gestation of pregnancy: Secondary | ICD-10-CM

## 2018-10-28 ENCOUNTER — Encounter (HOSPITAL_COMMUNITY): Payer: Self-pay

## 2018-10-28 ENCOUNTER — Ambulatory Visit (HOSPITAL_COMMUNITY)
Admission: RE | Admit: 2018-10-28 | Discharge: 2018-10-28 | Disposition: A | Discharge status: Home / Self Care | Payer: 59 | Source: Ambulatory Visit | Attending: Obstetrics & Gynecology | Admitting: Obstetrics & Gynecology

## 2018-10-28 ENCOUNTER — Other Ambulatory Visit (HOSPITAL_COMMUNITY): Payer: Self-pay | Admitting: *Deleted

## 2018-10-28 DIAGNOSIS — Z3A29 29 weeks gestation of pregnancy: Secondary | ICD-10-CM | POA: Insufficient documentation

## 2018-10-28 DIAGNOSIS — O358XX0 Maternal care for other (suspected) fetal abnormality and damage, not applicable or unspecified: Secondary | ICD-10-CM

## 2018-10-28 DIAGNOSIS — O26843 Uterine size-date discrepancy, third trimester: Secondary | ICD-10-CM | POA: Diagnosis not present

## 2018-10-28 DIAGNOSIS — O283 Abnormal ultrasonic finding on antenatal screening of mother: Secondary | ICD-10-CM | POA: Diagnosis not present

## 2018-10-28 DIAGNOSIS — Z363 Encounter for antenatal screening for malformations: Secondary | ICD-10-CM | POA: Diagnosis present

## 2018-11-10 ENCOUNTER — Ambulatory Visit (HOSPITAL_COMMUNITY): Payer: 59

## 2018-11-25 ENCOUNTER — Ambulatory Visit (HOSPITAL_COMMUNITY): Payer: 59

## 2018-11-25 ENCOUNTER — Encounter (HOSPITAL_COMMUNITY): Payer: Self-pay

## 2018-12-08 DIAGNOSIS — Z3483 Encounter for supervision of other normal pregnancy, third trimester: Secondary | ICD-10-CM | POA: Diagnosis not present

## 2018-12-08 DIAGNOSIS — Z3482 Encounter for supervision of other normal pregnancy, second trimester: Secondary | ICD-10-CM | POA: Diagnosis not present

## 2018-12-09 NOTE — L&D Delivery Note (Signed)
Delivery Note At 6:52 AM a viable female was delivered via Vaginal, Spontaneous (Presentation: OA).  APGAR: 9,9 weight: pending Placenta status: to L&D  Cord:  with the following complications: none.  Cord pH: n/a  Anesthesia:  epidural Episiotomy: None Lacerations: None Suture Repair: n/a Est. Blood Loss (mL): 50  Mom to postpartum.  Baby to Couplet care / Skin to Skin.  Alessandra BevelsJennifer M Aakash Hollomon 12/22/2018, 7:10 AM

## 2018-12-10 DIAGNOSIS — J069 Acute upper respiratory infection, unspecified: Secondary | ICD-10-CM | POA: Diagnosis not present

## 2018-12-10 DIAGNOSIS — R05 Cough: Secondary | ICD-10-CM | POA: Diagnosis not present

## 2018-12-14 DIAGNOSIS — Z3483 Encounter for supervision of other normal pregnancy, third trimester: Secondary | ICD-10-CM | POA: Diagnosis not present

## 2018-12-21 ENCOUNTER — Inpatient Hospital Stay (HOSPITAL_COMMUNITY)
Admission: AD | Admit: 2018-12-21 | Discharge: 2018-12-24 | DRG: 807 | Disposition: A | Discharge status: Home / Self Care | Payer: 59 | Attending: Obstetrics and Gynecology | Admitting: Obstetrics and Gynecology

## 2018-12-21 DIAGNOSIS — O4292 Full-term premature rupture of membranes, unspecified as to length of time between rupture and onset of labor: Secondary | ICD-10-CM | POA: Diagnosis present

## 2018-12-21 DIAGNOSIS — O99214 Obesity complicating childbirth: Secondary | ICD-10-CM | POA: Diagnosis present

## 2018-12-21 DIAGNOSIS — O4290 Premature rupture of membranes, unspecified as to length of time between rupture and onset of labor, unspecified weeks of gestation: Secondary | ICD-10-CM | POA: Diagnosis present

## 2018-12-21 DIAGNOSIS — F419 Anxiety disorder, unspecified: Secondary | ICD-10-CM | POA: Diagnosis present

## 2018-12-21 DIAGNOSIS — O99344 Other mental disorders complicating childbirth: Secondary | ICD-10-CM | POA: Diagnosis present

## 2018-12-21 DIAGNOSIS — Z3A37 37 weeks gestation of pregnancy: Secondary | ICD-10-CM

## 2018-12-21 DIAGNOSIS — O1414 Severe pre-eclampsia complicating childbirth: Principal | ICD-10-CM | POA: Diagnosis present

## 2018-12-21 HISTORY — DX: Anxiety disorder, unspecified: F41.9

## 2018-12-21 NOTE — MAU Note (Signed)
SROM at 2300-yellow fluid.  No VB.  Just started feeling a contraction on the way here.  Reports no complications with the pregnancy.  + FM.

## 2018-12-22 ENCOUNTER — Inpatient Hospital Stay (HOSPITAL_COMMUNITY): Payer: 59 | Admitting: Anesthesiology

## 2018-12-22 ENCOUNTER — Encounter (HOSPITAL_COMMUNITY): Payer: Self-pay | Admitting: Emergency Medicine

## 2018-12-22 ENCOUNTER — Other Ambulatory Visit: Payer: Self-pay

## 2018-12-22 DIAGNOSIS — F419 Anxiety disorder, unspecified: Secondary | ICD-10-CM | POA: Diagnosis present

## 2018-12-22 DIAGNOSIS — O99214 Obesity complicating childbirth: Secondary | ICD-10-CM | POA: Diagnosis present

## 2018-12-22 DIAGNOSIS — O1414 Severe pre-eclampsia complicating childbirth: Secondary | ICD-10-CM | POA: Diagnosis not present

## 2018-12-22 DIAGNOSIS — Z3A37 37 weeks gestation of pregnancy: Secondary | ICD-10-CM | POA: Diagnosis not present

## 2018-12-22 DIAGNOSIS — O99344 Other mental disorders complicating childbirth: Secondary | ICD-10-CM | POA: Diagnosis present

## 2018-12-22 DIAGNOSIS — O4292 Full-term premature rupture of membranes, unspecified as to length of time between rupture and onset of labor: Secondary | ICD-10-CM | POA: Diagnosis present

## 2018-12-22 DIAGNOSIS — O4290 Premature rupture of membranes, unspecified as to length of time between rupture and onset of labor, unspecified weeks of gestation: Secondary | ICD-10-CM | POA: Diagnosis present

## 2018-12-22 LAB — COMPREHENSIVE METABOLIC PANEL
ALT: 11 U/L (ref 0–44)
AST: 21 U/L (ref 15–41)
Albumin: 2.5 g/dL — ABNORMAL LOW (ref 3.5–5.0)
Alkaline Phosphatase: 137 U/L — ABNORMAL HIGH (ref 38–126)
Anion gap: 7 (ref 5–15)
BUN: 12 mg/dL (ref 6–20)
CO2: 19 mmol/L — ABNORMAL LOW (ref 22–32)
Calcium: 8.8 mg/dL — ABNORMAL LOW (ref 8.9–10.3)
Chloride: 110 mmol/L (ref 98–111)
Creatinine, Ser: 0.94 mg/dL (ref 0.44–1.00)
GFR calc Af Amer: >60 mL/min (ref >60)
Glucose, Bld: 76 mg/dL (ref 70–99)
Potassium: 4.3 mmol/L (ref 3.5–5.1)
Sodium: 136 mmol/L (ref 135–145)
TOTAL PROTEIN: 5.4 g/dL — AB (ref 6.5–8.1)
Total Bilirubin: 0.6 mg/dL (ref 0.3–1.2)

## 2018-12-22 LAB — CBC
HCT: 33.9 % — ABNORMAL LOW (ref 36.0–46.0)
HEMATOCRIT: 34.5 % — AB (ref 36.0–46.0)
Hemoglobin: 10.9 g/dL — ABNORMAL LOW (ref 12.0–15.0)
Hemoglobin: 10.6 g/dL — ABNORMAL LOW (ref 12.0–15.0)
MCH: 26.2 pg (ref 26.0–34.0)
MCH: 25.4 pg — ABNORMAL LOW (ref 26.0–34.0)
MCHC: 31.6 g/dL (ref 30.0–36.0)
MCHC: 31.3 g/dL (ref 30.0–36.0)
MCV: 82.9 fL (ref 80.0–100.0)
MCV: 81.3 fL (ref 80.0–100.0)
NRBC: 0.2 % (ref 0.0–0.2)
PLATELETS: 161 10*3/uL (ref 150–400)
Platelets: 169 10*3/uL (ref 150–400)
RBC: 4.16 MIL/uL (ref 3.87–5.11)
RBC: 4.17 MIL/uL (ref 3.87–5.11)
RDW: 16.8 % — AB (ref 11.5–15.5)
RDW: 17 % — AB (ref 11.5–15.5)
WBC: 8.7 10*3/uL (ref 4.0–10.5)
WBC: 12.2 10*3/uL — ABNORMAL HIGH (ref 4.0–10.5)
nRBC: 0 % (ref 0.0–0.2)

## 2018-12-22 LAB — PROTEIN / CREATININE RATIO, URINE
Creatinine, Urine: 54 mg/dL
Protein Creatinine Ratio: 1.83 mg/mg{Cre} — ABNORMAL HIGH (ref 0.00–0.15)
Total Protein, Urine: 99 mg/dL

## 2018-12-22 LAB — TYPE AND SCREEN
ABO/RH(D): A POS
Antibody Screen: NEGATIVE

## 2018-12-22 LAB — RPR: RPR Ser Ql: NONREACTIVE

## 2018-12-22 LAB — POCT FERN TEST: POCT Fern Test: POSITIVE

## 2018-12-22 MED ORDER — OXYCODONE-ACETAMINOPHEN 5-325 MG PO TABS: 2.0000 | ORAL_TABLET | ORAL | Status: DC | PRN

## 2018-12-22 MED ORDER — LIDOCAINE-EPINEPHRINE (PF) 2 %-1:200000 IJ SOLN
INTRAMUSCULAR | Status: DC | PRN
  Administered 2018-12-22: 2 mL via EPIDURAL
  Administered 2018-12-22: 5 mL via EPIDURAL

## 2018-12-22 MED ORDER — MAGNESIUM SULFATE BOLUS VIA INFUSION
4.0000 g | Freq: Once | INTRAVENOUS | Status: AC
  Administered 2018-12-22: 4 g via INTRAVENOUS
  Filled 2018-12-22: qty 500

## 2018-12-22 MED ORDER — LIDOCAINE HCL (PF) 1 % IJ SOLN
30.0000 mL | INTRAMUSCULAR | Status: DC | PRN
  Filled 2018-12-22: qty 30

## 2018-12-22 MED ORDER — ESCITALOPRAM OXALATE 20 MG PO TABS
20.0000 mg | ORAL_TABLET | Freq: Every day | ORAL | Status: DC
  Administered 2018-12-23 – 2018-12-24 (×2): 20 mg via ORAL
  Filled 2018-12-22 (×2): qty 1

## 2018-12-22 MED ORDER — FENTANYL 2.5 MCG/ML BUPIVACAINE 1/10 % EPIDURAL INFUSION (WH - ANES)
14.0000 mL/h | INTRAMUSCULAR | Status: DC | PRN
  Administered 2018-12-22: 14 mL/h via EPIDURAL
  Filled 2018-12-22: qty 100

## 2018-12-22 MED ORDER — PRENATAL MULTIVITAMIN CH
1.0000 | ORAL_TABLET | Freq: Every day | ORAL | Status: DC
  Administered 2018-12-22 – 2018-12-23 (×2): 1 via ORAL
  Filled 2018-12-22 (×2): qty 1

## 2018-12-22 MED ORDER — SENNOSIDES-DOCUSATE SODIUM 8.6-50 MG PO TABS
2.0000 | ORAL_TABLET | ORAL | Status: DC
  Administered 2018-12-22 – 2018-12-23 (×2): 2 via ORAL
  Filled 2018-12-22 (×2): qty 2

## 2018-12-22 MED ORDER — LACTATED RINGERS IV SOLN
INTRAVENOUS | Status: DC
  Administered 2018-12-22: 07:00:00 via INTRAVENOUS

## 2018-12-22 MED ORDER — OXYTOCIN BOLUS FROM INFUSION
500.0000 mL | Freq: Once | INTRAVENOUS | Status: AC
  Administered 2018-12-22: 500 mL via INTRAVENOUS

## 2018-12-22 MED ORDER — LACTATED RINGERS IV SOLN: 500.0000 mL | INTRAVENOUS | Status: DC | PRN

## 2018-12-22 MED ORDER — IBUPROFEN 600 MG PO TABS
600.0000 mg | ORAL_TABLET | Freq: Four times a day (QID) | ORAL | Status: DC
  Administered 2018-12-22 – 2018-12-24 (×8): 600 mg via ORAL
  Filled 2018-12-22 (×8): qty 1

## 2018-12-22 MED ORDER — PHENYLEPHRINE 40 MCG/ML (10ML) SYRINGE FOR IV PUSH (FOR BLOOD PRESSURE SUPPORT)
80.0000 ug | PREFILLED_SYRINGE | INTRAVENOUS | Status: DC | PRN
  Filled 2018-12-22: qty 10

## 2018-12-22 MED ORDER — EPHEDRINE 5 MG/ML INJ
10.0000 mg | INTRAVENOUS | Status: DC | PRN
  Filled 2018-12-22: qty 2

## 2018-12-22 MED ORDER — SIMETHICONE 80 MG PO CHEW: 80.0000 mg | CHEWABLE_TABLET | ORAL | Status: DC | PRN

## 2018-12-22 MED ORDER — DIPHENHYDRAMINE HCL 25 MG PO CAPS: 25.0000 mg | ORAL_CAPSULE | Freq: Four times a day (QID) | ORAL | Status: DC | PRN

## 2018-12-22 MED ORDER — WITCH HAZEL-GLYCERIN EX PADS: 1.0000 "application " | MEDICATED_PAD | CUTANEOUS | Status: DC | PRN

## 2018-12-22 MED ORDER — COCONUT OIL OIL: 1.0000 "application " | TOPICAL_OIL | Status: DC | PRN

## 2018-12-22 MED ORDER — LABETALOL HCL 5 MG/ML IV SOLN: 80.0000 mg | INTRAVENOUS | Status: DC | PRN

## 2018-12-22 MED ORDER — BENZOCAINE-MENTHOL 20-0.5 % EX AERO: 1.0000 "application " | INHALATION_SPRAY | CUTANEOUS | Status: DC | PRN

## 2018-12-22 MED ORDER — CITALOPRAM HYDROBROMIDE 20 MG PO TABS
20.0000 mg | ORAL_TABLET | Freq: Every day | ORAL | Status: DC
  Administered 2018-12-22: 20 mg via ORAL
  Filled 2018-12-22 (×2): qty 1

## 2018-12-22 MED ORDER — LABETALOL HCL 5 MG/ML IV SOLN: 40.0000 mg | INTRAVENOUS | Status: DC | PRN

## 2018-12-22 MED ORDER — LABETALOL HCL 5 MG/ML IV SOLN
20.0000 mg | INTRAVENOUS | Status: DC | PRN
  Administered 2018-12-22: 20 mg via INTRAVENOUS

## 2018-12-22 MED ORDER — HYDRALAZINE HCL 20 MG/ML IJ SOLN: 10.0000 mg | INTRAMUSCULAR | Status: DC | PRN

## 2018-12-22 MED ORDER — LACTATED RINGERS IV SOLN: 500.0000 mL | Freq: Once | INTRAVENOUS | Status: DC

## 2018-12-22 MED ORDER — ONDANSETRON HCL 4 MG PO TABS: 4.0000 mg | ORAL_TABLET | ORAL | Status: DC | PRN

## 2018-12-22 MED ORDER — ONDANSETRON HCL 4 MG/2ML IJ SOLN: 4.0000 mg | Freq: Four times a day (QID) | INTRAMUSCULAR | Status: DC | PRN

## 2018-12-22 MED ORDER — ACETAMINOPHEN 325 MG PO TABS: 650.0000 mg | ORAL_TABLET | ORAL | Status: DC | PRN

## 2018-12-22 MED ORDER — OXYCODONE-ACETAMINOPHEN 5-325 MG PO TABS: 1.0000 | ORAL_TABLET | ORAL | Status: DC | PRN

## 2018-12-22 MED ORDER — FENTANYL CITRATE (PF) 100 MCG/2ML IJ SOLN: 50.0000 ug | INTRAMUSCULAR | Status: DC | PRN

## 2018-12-22 MED ORDER — ZOLPIDEM TARTRATE 5 MG PO TABS: 5.0000 mg | ORAL_TABLET | Freq: Every evening | ORAL | Status: DC | PRN

## 2018-12-22 MED ORDER — OXYTOCIN 40 UNITS IN NORMAL SALINE INFUSION - SIMPLE MED
2.5000 [IU]/h | INTRAVENOUS | Status: DC
  Filled 2018-12-22: qty 1000

## 2018-12-22 MED ORDER — DIPHENHYDRAMINE HCL 50 MG/ML IJ SOLN: 12.5000 mg | INTRAMUSCULAR | Status: DC | PRN

## 2018-12-22 MED ORDER — DIBUCAINE 1 % RE OINT: 1.0000 "application " | TOPICAL_OINTMENT | RECTAL | Status: DC | PRN

## 2018-12-22 MED ORDER — SOD CITRATE-CITRIC ACID 500-334 MG/5ML PO SOLN: 30.0000 mL | ORAL | Status: DC | PRN

## 2018-12-22 MED ORDER — PHENYLEPHRINE 40 MCG/ML (10ML) SYRINGE FOR IV PUSH (FOR BLOOD PRESSURE SUPPORT)
80.0000 ug | PREFILLED_SYRINGE | INTRAVENOUS | Status: DC | PRN
  Filled 2018-12-22 (×2): qty 10

## 2018-12-22 MED ORDER — LACTATED RINGERS IV SOLN
INTRAVENOUS | Status: DC
  Administered 2018-12-22: 23:00:00 via INTRAVENOUS

## 2018-12-22 MED ORDER — MAGNESIUM SULFATE 40 G IN LACTATED RINGERS - SIMPLE
2.0000 g/h | INTRAVENOUS | Status: AC
  Administered 2018-12-22 – 2018-12-23 (×2): 2 g/h via INTRAVENOUS
  Filled 2018-12-22 (×2): qty 500

## 2018-12-22 MED ORDER — ONDANSETRON HCL 4 MG/2ML IJ SOLN: 4.0000 mg | INTRAMUSCULAR | Status: DC | PRN

## 2018-12-22 MED ORDER — LABETALOL HCL 5 MG/ML IV SOLN
INTRAVENOUS | Status: AC
  Filled 2018-12-22: qty 4

## 2018-12-22 NOTE — H&P (Addendum)
HPI: 28 y/o G3P2002 @ [redacted]w[redacted]d estimated gestational age (as dated by LMP c/w 20 week ultrasound) initially admitted for rupture of membranes and  preeclampsia without severe features, now with severe features based on blood pressure.   + Leaking of Fluid,   no Vaginal Bleeding,   + Uterine Contractions,  + Fetal Movement.  Prenatal care has been provided by Dr. Charlotta Newton  ROS: She denies headache on admission- notes slight headache now 3/10, no epigastric pain, no visual changes.    Pregnancy complicated by: 1) anxiety- on Celexa 20mg  daily   Prenatal Transfer Tool  Maternal Diabetes: No Genetic Screening: Normal Maternal Ultrasounds/Referrals: Abnormal:  Findings:   Isolated EIF (echogenic intracardiac focus) Fetal Ultrasounds or other Referrals:  None, Referred to Materal Fetal Medicine - no other abnormalities noted Maternal Substance Abuse:  No Significant Maternal Medications:  Meds include: Other: Lexapro 20mg  daily Significant Maternal Lab Results: Lab values include: Group B Strep negative   PNL:  GBS neg, Rub Immune, Hep B neg, RPR NR, HIV neg, GC/C neg, glucola:133 H/H: 11.6 Blood type: A positive, antibody neg  Immunizations: Tdap: 11/15 Flu: 9/23  OBHx: FTNSVD x 2- both uncomplicated 8#2-8#6oz PMHx:  Anxiety Meds:  PNV, Lexapro Allergy:  No Known Allergies SurgHx: none SocHx:   no Tobacco, no  EtOH, no Illicit Drugs  O: BP (!) 144/77   Pulse 89   Temp 98.1 F (36.7 C) (Oral)   Resp 16   Ht 5\' 1"  (1.549 m)   Wt 101.7 kg   LMP 04/04/2018   SpO2 99%   BMI 42.37 kg/m   BP range: 102-168/44-101  Gen. AAOx3, NAD CV.  RRR  No murmur.  Resp. CTAB, no wheeze or crackles. Abd. Gravid,  no tenderness,  no rigidity,  no guarding Extr.  2+ non-pitting edema B/L extended to thighs and abdomen , no calf tenderness, neg Homan's B/L, 1+ clonus FHT: 120 baseline, moderate variability, + accels,  Occasional variable decels Toco: q 2-5 min SVE: 7-8/100-1 per RN  Labs:   Results for orders placed or performed during the hospital encounter of 12/21/18 (from the past 24 hour(s))  Fern Test     Status: Abnormal   Collection Time: 12/22/18 12:21 AM  Result Value Ref Range   POCT Fern Test Positive = ruptured amniotic membanes   CBC     Status: Abnormal   Collection Time: 12/22/18 12:24 AM  Result Value Ref Range   WBC 8.7 4.0 - 10.5 K/uL   RBC 4.16 3.87 - 5.11 MIL/uL   Hemoglobin 10.9 (L) 12.0 - 15.0 g/dL   HCT 73.7 (L) 10.6 - 26.9 %   MCV 82.9 80.0 - 100.0 fL   MCH 26.2 26.0 - 34.0 pg   MCHC 31.6 30.0 - 36.0 g/dL   RDW 48.5 (H) 46.2 - 70.3 %   Platelets 161 150 - 400 K/uL   nRBC 0.0 0.0 - 0.2 %  Type and screen [facility]     Status: None   Collection Time: 12/22/18 12:24 AM  Result Value Ref Range   ABO/RH(D) A POS    Antibody Screen NEG    Sample Expiration      12/25/2018 Performed at [facility], [address] [place], [place] [address]   Comprehensive metabolic panel     Status: Abnormal   Collection Time: 12/22/18 12:24 AM  Result Value Ref Range   Sodium 136 135 - 145 mmol/L   Potassium 4.3 3.5 - 5.1 mmol/L  Chloride 110 98 - 111 mmol/L   CO2 19 (L) 22 - 32 mmol/L   Glucose, Bld 76 70 - 99 mg/dL   BUN 12 6 - 20 mg/dL   Creatinine, Ser 1.610.94 0.44 - 1.00 mg/dL   Calcium 8.8 (L) 8.9 - 10.3 mg/dL   Total Protein 5.4 (L) 6.5 - 8.1 g/dL   Albumin 2.5 (L) 3.5 - 5.0 g/dL   AST 21 15 - 41 U/L   ALT 11 0 - 44 U/L   Alkaline Phosphatase 137 (H) 38 - 126 U/L   Total Bilirubin 0.6 0.3 - 1.2 mg/dL   GFR calc non Af Amer >60 >60 mL/min   GFR calc Af Amer >60 >60 mL/min   Anion gap 7 5 - 15  Protein / creatinine ratio, urine     Status: Abnormal   Collection Time: 12/22/18  1:10 AM  Result Value Ref Range   Creatinine, Urine 54.00 mg/dL   Total Protein, Urine 99 mg/dL   Protein Creatinine Ratio 1.83 (H) 0.00 - 0.15 mg/mg[Cre]    A/P:  28 y.o. [id number] @ 4367w3d EGA who presented SROM now with preeclampsia with  severe features -FWB:  NICHD Cat II FHTs -Labor: expectant management -GBS: negative -Preeclampsia with severe features- plan to continue with Magnesium  Pt with slight headache this am  IV Labetalol protocol as needed -Pain: continue epidural  Myna HidalgoJennifer Sanjeev Main, DO [phone] (cell) [phone] (office)

## 2018-12-22 NOTE — Anesthesia Postprocedure Evaluation (Signed)
Anesthesia Post Note  Patient: Joyce Schneider  Procedure(s) Performed: AN AD HOC LABOR EPIDURAL     Patient location during evaluation: [facility] Anesthesia Type: Epidural Level of consciousness: awake, awake and alert and oriented Pain management: pain level controlled Vital Signs Assessment: post-procedure vital signs reviewed and stable Respiratory status: spontaneous breathing, nonlabored ventilation and respiratory function stable Cardiovascular status: stable Postop Assessment: no headache, no backache, no apparent nausea or vomiting, patient able to bend at knees, adequate PO intake and able to ambulate Anesthetic complications: no    Last Vitals:  Vitals:   12/22/18 1500 12/22/18 1515  BP:  125/67  Pulse:  83  Resp: 19 20  Temp:  36.7 C  SpO2:  98%    Last Pain:  Vitals:   12/22/18 1515  TempSrc: Oral  PainSc:    Pain Goal:                @ANFLOW60MIN (12500)  )Angell Honse

## 2018-12-22 NOTE — Anesthesia Procedure Notes (Signed)
Epidural Patient location during procedure: OB Start time: 12/22/2018 2:43 AM End time: 12/22/2018 2:57 AM  Staffing Anesthesiologist: Lucretia Kern, MD Performed: anesthesiologist   Preanesthetic Checklist Completed: patient identified, pre-op evaluation, timeout performed, IV checked, risks and benefits discussed and monitors and equipment checked  Epidural Patient position: sitting Prep: DuraPrep Patient monitoring: heart rate, continuous pulse ox and blood pressure Approach: midline Location: L3-L4 Injection technique: LOR air  Needle:  Needle type: Tuohy  Needle gauge: 17 G Needle length: 9 cm Needle insertion depth: 7 cm Catheter type: closed end flexible Catheter size: 19 Gauge Catheter at skin depth: 12 cm  Assessment Events: blood not aspirated, injection not painful, no injection resistance, negative IV test and no paresthesia  Additional Notes Reason for block:procedure for pain

## 2018-12-22 NOTE — Lactation Note (Addendum)
This note was copied from a baby's chart. Lactation Consultation Note  Patient Name: Joyce Schneider Today's Date: 12/22/2018   P3, Ex BF 6 mos & 1 year.  Mother/RN requesting assistance w/ latching due to areola edema.   Visitors entered room and mother decided to wait on latching. RN requested NS be brought to room due to edema. LC provided mother with shells.  Mother recently pumped sm volume with manual pump. Mother states baby latched well this morning. Encouraged STS and spoon feeding to interest baby in latching at next attempt. Suggest prepumping before latching and wear shells. Feed on demand approximately 8-12 times per day.   Mother aware of cluster feeding. Mom made aware of O/P services, breastfeeding support groups, community resources, and our phone # for post-discharge questions.        Maternal Data    Feeding Feeding Type: Breast Milk  LATCH Score                   Interventions    Lactation Tools Discussed/Used     Consult Status      Hardie Pulley 12/22/2018, 6:15 PM

## 2018-12-22 NOTE — Anesthesia Preprocedure Evaluation (Signed)
Anesthesia Evaluation  Patient identified by MRN, date of birth, ID band Patient awake    Reviewed: Allergy & Precautions, H&P , NPO status , Patient's Chart, lab work & pertinent test results  History of Anesthesia Complications Negative for: history of anesthetic complications  Airway Mallampati: II  TM Distance: >3 FB Neck ROM: full    Dental no notable dental hx.    Pulmonary neg pulmonary ROS,    Pulmonary exam normal        Cardiovascular negative cardio ROS Normal cardiovascular exam Rhythm:regular Rate:Normal     Neuro/Psych PSYCHIATRIC DISORDERS Anxiety Depression negative neurological ROS     GI/Hepatic Neg liver ROS, GERD  ,  Endo/Other  Morbid obesity  Renal/GU negative Renal ROS  negative genitourinary   Musculoskeletal   Abdominal   Peds  Hematology negative hematology ROS (+)   Anesthesia Other Findings   Reproductive/Obstetrics (+) Pregnancy                             Anesthesia Physical Anesthesia Plan  ASA: III  Anesthesia Plan: Epidural   Post-op Pain Management:    Induction:   PONV Risk Score and Plan:   Airway Management Planned:   Additional Equipment:   Intra-op Plan:   Post-operative Plan:   Informed Consent: I have reviewed the patients History and Physical, chart, labs and discussed the procedure including the risks, benefits and alternatives for the proposed anesthesia with the patient or authorized representative who has indicated his/her understanding and acceptance.     Plan Discussed with:   Anesthesia Plan Comments:         Anesthesia Quick Evaluation

## 2018-12-22 NOTE — Anesthesia Postprocedure Evaluation (Signed)
Anesthesia Post Note  Patient: Joyce Schneider  Procedure(s) Performed: AN AD HOC LABOR EPIDURAL     Patient location during evaluation: Antenatal Anesthesia Type: Epidural Level of consciousness: awake, awake and alert and oriented Pain management: pain level controlled Vital Signs Assessment: post-procedure vital signs reviewed and stable Respiratory status: spontaneous breathing and respiratory function stable Cardiovascular status: blood pressure returned to baseline Postop Assessment: no headache, no backache, epidural receding, patient able to bend at knees, no apparent nausea or vomiting, adequate PO intake and able to ambulate Anesthetic complications: no    Last Vitals:  Vitals:   12/22/18 1316 12/22/18 1415  BP: (!) 147/92 (!) 144/75  Pulse: 82 83  Resp: 18 18  Temp: (!) 36.3 C 36.6 C  SpO2: 100% 99%    Last Pain:  Vitals:   12/22/18 1415  TempSrc: Oral  PainSc:    Pain Goal:                @ANFLOW60MIN (12500)  )Cleda Clarks

## 2018-12-23 LAB — CBC
HCT: 28 % — ABNORMAL LOW (ref 36.0–46.0)
Hemoglobin: 8.9 g/dL — ABNORMAL LOW (ref 12.0–15.0)
MCH: 25.9 pg — ABNORMAL LOW (ref 26.0–34.0)
MCHC: 31.8 g/dL (ref 30.0–36.0)
MCV: 81.6 fL (ref 80.0–100.0)
Platelets: 132 10*3/uL — ABNORMAL LOW (ref 150–400)
RBC: 3.43 MIL/uL — ABNORMAL LOW (ref 3.87–5.11)
RDW: 17.3 % — ABNORMAL HIGH (ref 11.5–15.5)
WBC: 7.9 10*3/uL (ref 4.0–10.5)
nRBC: 0 % (ref 0.0–0.2)

## 2018-12-23 LAB — COMPREHENSIVE METABOLIC PANEL
ALT: 12 U/L (ref 0–44)
AST: 22 U/L (ref 15–41)
Albumin: 2 g/dL — ABNORMAL LOW (ref 3.5–5.0)
Alkaline Phosphatase: 97 U/L (ref 38–126)
Anion gap: 4 — ABNORMAL LOW (ref 5–15)
BILIRUBIN TOTAL: 0.3 mg/dL (ref 0.3–1.2)
BUN: 8 mg/dL (ref 6–20)
CO2: 21 mmol/L — ABNORMAL LOW (ref 22–32)
CREATININE: 0.68 mg/dL (ref 0.44–1.00)
Calcium: 7.6 mg/dL — ABNORMAL LOW (ref 8.9–10.3)
Chloride: 112 mmol/L — ABNORMAL HIGH (ref 98–111)
GFR calc Af Amer: >60 mL/min (ref >60)
GFR calc non Af Amer: >60 mL/min (ref >60)
Glucose, Bld: 103 mg/dL — ABNORMAL HIGH (ref 70–99)
Potassium: 4 mmol/L (ref 3.5–5.1)
Sodium: 137 mmol/L (ref 135–145)
Total Protein: 4.2 g/dL — ABNORMAL LOW (ref 6.5–8.1)

## 2018-12-23 MED ORDER — FAMOTIDINE 20 MG PO TABS
20.0000 mg | ORAL_TABLET | Freq: Two times a day (BID) | ORAL | Status: DC | PRN
  Administered 2018-12-23: 20 mg via ORAL
  Filled 2018-12-23: qty 1

## 2018-12-23 MED ORDER — CALCIUM CARBONATE ANTACID 500 MG PO CHEW: 2.0000 | CHEWABLE_TABLET | Freq: Three times a day (TID) | ORAL | Status: DC | PRN

## 2018-12-23 NOTE — Lactation Note (Addendum)
This note was copied from a baby's chart. Lactation Consultation Note  Patient Name: Joyce Schneider [id number] Date: 12/23/2018   F/u LC visit at 30 hours of life. Two RNs have shared that mother is not feeding infant with desired frequency. Mom reports that "Joyce Schneider" will take a few sucks and then fall asleep. Mom says she has been putting "drops" in infant's mouth. Mom is a P3, but her 1st 2 children were born at term. I explained to Mom that 37 week infants would sometimes rather sleep than eat & we need to be more assertive about getting them fed. Hand expression was taught to Mom & she expressed about 5 mL with ease. This was finger-fed to infant with a curved-tip syringe. After finger-feeding the 5 mL, the infant was put to the breast, but she quickly fell asleep. I suggested that Mom express more milk so that we could get a full feeding into Joyce Schneider. I offered to assist with hand expression, but Mom chose to use her Medela Sonata DEBP. Dad went home to get it.   Mom has my # to call when ready for me to return. Mom was shown how to wash pump parts.   Mom takes escitalopram 20mg  qd (L2).   Lurline Hare [facility] 12/23/2018, 2:19 PM

## 2018-12-23 NOTE — Progress Notes (Signed)
Postpartum Note Day # 1  S:  Patient resting comfortable in bed.  Pain controlled.  Tolerating general diet. + flatus, no BM.  Lochia moderate.  Ambulating without difficulty.  She denies n/v/f/c, SOB, or CP.  Pt plans on breastfeeding.  Denies headache, blurry vision, no RUQ pain  O: Temp:  [97.4 F (36.3 C)-98.7 F (37.1 C)] 98.2 F (36.8 C) (01/15 0503) Pulse Rate:  [70-97] 70 (01/15 0503) Resp:  [16-20] 17 (01/15 0503) BP: (101-151)/(61-99) 145/90 (01/15 0503) SpO2:  [97 %-100 %] 100 % (01/15 0503)  BP range: 101-151/61-99 UOP: 3150cc/8hr  Gen: A&Ox3, NAD CV: RRR, no MRG Resp: CTAB Abdomen: obese, soft, NT, ND Uterus: firm, non-tender, below umbilicus Ext: 2+ non-pitting edema, no calf tenderness bilaterally, 1+ clonus, 3+ DTRs bilaterally Labs:  CBC Latest Ref Rng & Units 12/22/2018 12/22/2018 01/30/2015  WBC 4.0 - 10.5 K/uL 12.2(H) 8.7 9.8  Hemoglobin 12.0 - 15.0 g/dL 10.6(L) 10.9(L) 11.6(L)  Hematocrit 36.0 - 46.0 % 33.9(L) 34.5(L) 35.2(L)  Platelets 150 - 400 K/uL 169 161 157    CMP pending this am  A/P: Pt is a 28 y.o. [id number] s/p NSVD complicated by preeclampsia with severe features -Preeclampsia  -plan to discontinue Magnesium this am  -no BP meds currently, plan to monitor today  -pt asymptomatic  -CBC stable, CMP pending  - Pain well controlled -GU: UOP is adequate -GI: Tolerating general diet -Activity: encouraged sitting up to chair and ambulation as tolerated -Prophylaxis: early ambulation  -Continue with postpartum care as outlined above  Myna Hidalgo, DO [phone] (cell) [phone] (office)

## 2018-12-23 NOTE — Lactation Note (Addendum)
This note was copied from a baby's chart. Lactation Consultation Note  Patient Name: Joyce Schneider [id number] Date: 12/23/2018 Reason for consult: Follow-up assessment;Early term 37-38.6wks;Infant weight loss  32 hours old early term female who is being exclusively BF by her mother, she's a P2. Dad called LC for assistance and when LC came to the room mom voiced she has decided to pump and bottle feed only. Mom is using her personal Medela Sonata DEBP from home and got 5 ml on her first pumping session. She has also brought her own bottles from home that have a wide nipple with a slow flow. Mom may decide to put baby to the breast later on like she did with her first child, first baby was also fed through pump and bottle feeding and a few feedings at the breast every so often. Reviewed supplementation guidelines for feeding at the breast as well as breastmilk storage guidelines.  Feeding plan:  1. Encouraged mom to pump every 2-3 hours during the day and at least once at night for 20 minutes (that was her pump setting) 2. Mom will feed baby any amount of EBM that she may get, mom aware of volumes required for supplementation according to baby's age in hours. If mom is unable to meet volume needed, she's aware that supplementation with formula will be necessary. 3. Mom will still try putting baby to the breast, will use NS PRN (she had two in her room).  4. Breastshells also to be used PRN upon her discretion  Parents reported all questions and concerns were answered, they're both aware of LC services and will call PRN.  Maternal Data Has patient been taught Hand Expression?: Yes  Feeding Feeding Type: Breast Milk  LATCH Score Latch: Too sleepy or reluctant, no latch achieved, no sucking elicited.  Audible Swallowing: None  Type of Nipple: Everted at rest and after stimulation  Comfort (Breast/Nipple): Soft / non-tender  Hold (Positioning): Assistance needed to correctly position  infant at breast and maintain latch.  LATCH Score: 5  Interventions Interventions: Breast feeding basics reviewed;DEBP;Expressed milk  Lactation Tools Discussed/Used Tools: Pump Breast pump type: Double-Electric Breast Pump Pump Review: Setup, frequency, and cleaning Initiated by:: MPeck Date initiated:: 12/23/18   Consult Status Consult Status: PRN Date: 12/24/18 Follow-up type: In-patient    Azile Minardi Venetia Constable 12/23/2018, 3:44 PM

## 2018-12-24 MED ORDER — NIFEDIPINE ER OSMOTIC RELEASE 30 MG PO TB24
30.0000 mg | ORAL_TABLET | Freq: Every day | ORAL | Status: DC
  Administered 2018-12-24: 30 mg via ORAL
  Filled 2018-12-24: qty 1

## 2018-12-24 MED ORDER — NIFEDIPINE 10 MG PO CAPS
30.0000 mg | ORAL_CAPSULE | Freq: Every day | ORAL | Status: DC
  Administered 2018-12-24: 30 mg via ORAL
  Filled 2018-12-24: qty 3

## 2018-12-24 MED ORDER — NIFEDIPINE ER 30 MG PO TB24: 30.0000 mg | ORAL_TABLET | Freq: Every day | ORAL | 3 refills | Status: AC

## 2018-12-24 MED ORDER — IBUPROFEN 600 MG PO TABS: 600.0000 mg | ORAL_TABLET | Freq: Four times a day (QID) | ORAL | 0 refills | Status: AC

## 2018-12-24 NOTE — Discharge Instructions (Signed)
Vaginal Delivery, Care After °Refer to this sheet in the next few weeks. These instructions provide you with information about caring for yourself after vaginal delivery. Your health care provider may also give you more specific instructions. Your treatment has been planned according to current medical practices, but problems sometimes occur. Call your health care provider if you have any problems or questions. °What can I expect after the procedure? °After vaginal delivery, it is common to have: °· Some bleeding from your vagina. °· Soreness in your abdomen, your vagina, and the area of skin between your vaginal opening and your anus (perineum). °· Pelvic cramps. °· Fatigue. °Follow these instructions at home: °Medicines °· Take over-the-counter and prescription medicines only as told by your health care provider. °· If you were prescribed an antibiotic medicine, take it as told by your health care provider. Do not stop taking the antibiotic until it is finished. °Driving ° °· Do not drive or operate heavy machinery while taking prescription pain medicine. °· Do not drive for 24 hours if you received a sedative. °Lifestyle °· Do not drink alcohol. This is especially important if you are breastfeeding or taking medicine to relieve pain. °· Do not use tobacco products, including cigarettes, chewing tobacco, or e-cigarettes. If you need help quitting, ask your health care provider. °Eating and drinking °· Drink at least 8 eight-ounce glasses of water every day unless you are told not to by your health care provider. If you choose to breastfeed your baby, you may need to drink more water than this. °· Eat high-fiber foods every day. These foods may help prevent or relieve constipation. High-fiber foods include: °? Whole grain cereals and breads. °? Brown rice. °? Beans. °? Fresh fruits and vegetables. °Activity °· Return to your normal activities as told by your health care provider. Ask your health care provider what  activities are safe for you. °· Rest as much as possible. Try to rest or take a nap when your baby is sleeping. °· Do not lift anything that is heavier than your baby or 10 lb (4.5 kg) until your health care provider says that it is safe. °· Talk with your health care provider about when you can engage in sexual activity. This may depend on your: °? Risk of infection. °? Rate of healing. °? Comfort and desire to engage in sexual activity. °Vaginal Care °· If you have an episiotomy or a vaginal tear, check the area every day for signs of infection. Check for: °? More redness, swelling, or pain. °? More fluid or blood. °? Warmth. °? Pus or a bad smell. °· Do not use tampons or douches until your health care provider says this is safe. °· Watch for any blood clots that may pass from your vagina. These may look like clumps of dark red, brown, or black discharge. °General instructions °· Keep your perineum clean and dry as told by your health care provider. °· Wear loose, comfortable clothing. °· Wipe from front to back when you use the toilet. °· Ask your health care provider if you can shower or take a bath. If you had an episiotomy or a perineal tear during labor and delivery, your health care provider may tell you not to take baths for a certain length of time. °· Wear a bra that supports your breasts and fits you well. °· If possible, have someone help you with household activities and help care for your baby for at least a few days after you   leave the hospital. °· Keep all follow-up visits for you and your baby as told by your health care provider. This is important. °Contact a health care provider if: °· You have: °? Vaginal discharge that has a bad smell. °? Difficulty urinating. °? Pain when urinating. °? A sudden increase or decrease in the frequency of your bowel movements. °? More redness, swelling, or pain around your episiotomy or vaginal tear. °? More fluid or blood coming from your episiotomy or vaginal  tear. °? Pus or a bad smell coming from your episiotomy or vaginal tear. °? A fever. °? A rash. °? Little or no interest in activities you used to enjoy. °? Questions about caring for yourself or your baby. °· Your episiotomy or vaginal tear feels warm to the touch. °· Your episiotomy or vaginal tear is separating or does not appear to be healing. °· Your breasts are painful, hard, or turn red. °· You feel unusually sad or worried. °· You feel nauseous or you vomit. °· You pass large blood clots from your vagina. If you pass a blood clot from your vagina, save it to show to your health care provider. Do not flush blood clots down the toilet without having your health care provider look at them. °· You urinate more than usual. °· You are dizzy or light-headed. °· You have not breastfed at all and you have not had a menstrual period for 12 weeks after delivery. °· You have stopped breastfeeding and you have not had a menstrual period for 12 weeks after you stopped breastfeeding. °Get help right away if: °· You have: °? Pain that does not go away or does not get better with medicine. °? Chest pain. °? Difficulty breathing. °? Blurred vision or spots in your vision. °? Thoughts about hurting yourself or your baby. °· You develop pain in your abdomen or in one of your legs. °· You develop a severe headache. °· You faint. °· You bleed from your vagina so much that you fill two sanitary pads in one hour. °This information is not intended to replace advice given to you by your health care provider. Make sure you discuss any questions you have with your health care provider. °Document Released: 11/22/2000 Document Revised: 05/08/2016 Document Reviewed: 12/10/2015 °Elsevier Interactive Patient Education © 2019 [employer] ° ° °Preeclampsia and Eclampsia ° °Preeclampsia is a serious condition that may develop during pregnancy. It is also called toxemia of pregnancy. This condition causes high blood pressure along with other  symptoms, such as swelling and headaches. These symptoms may develop as the condition gets worse. Preeclampsia may occur at 20 weeks of pregnancy or later. °Diagnosing and treating preeclampsia early is very important. If not treated early, it can cause serious problems for you and your baby. One problem it can lead to is eclampsia. Eclampsia is a condition that causes muscle jerking or shaking (convulsions or seizures) and other serious problems for the mother. During pregnancy, delivering your baby may be the best treatment for preeclampsia or eclampsia. For most women, preeclampsia and eclampsia symptoms go away after giving birth. °In rare cases, a woman may develop preeclampsia after giving birth (postpartum preeclampsia). This usually occurs within 48 hours after childbirth but may occur up to 6 weeks after giving birth. °What are the causes? °The cause of preeclampsia is not known. °What increases the risk? °The following risk factors make you more likely to develop preeclampsia: °· Being pregnant for the first time. °· Having had preeclampsia   during a past pregnancy.  Having a family history of preeclampsia.  Having high blood pressure.  Being pregnant with more than one baby.  Being 46 or older.  Being African-American.  Having kidney disease or diabetes.  Having medical conditions such as lupus or blood diseases.  Being very overweight (obese). What are the signs or symptoms? The earliest signs of preeclampsia are:  High blood pressure.  Increased protein in your urine. Your health care provider will check for this at every visit before you give birth (prenatal visit). Other symptoms that may develop as the condition gets worse include:  Severe headaches.  Sudden weight gain.  Swelling of the hands, face, legs, and feet.  Nausea and vomiting.  Vision problems, such as blurred or double vision.  Numbness in the face, arms, legs, and feet.  Urinating less than  usual.  Dizziness.  Slurred speech.  Abdominal pain, especially upper abdominal pain.  Convulsions or seizures. How is this diagnosed? There are no screening tests for preeclampsia. Your health care provider will ask you about symptoms and check for signs of preeclampsia during your prenatal visits. You may also have tests that include:  Urine tests.  Blood tests.  Checking your blood pressure.  Monitoring your babys heart rate.  Ultrasound. How is this treated? You and your health care provider will determine the treatment approach that is best for you. Treatment may include:  Having more frequent prenatal exams to check for signs of preeclampsia, if you have an increased risk for preeclampsia.  Medicine to lower your blood pressure.  Staying in the hospital, if your condition is severe. There, treatment will focus on controlling your blood pressure and the amount of fluids in your body (fluid retention).  Taking medicine (magnesium sulfate) to prevent seizures. This may be given as an injection or through an IV.  Taking a low-dose aspirin during your pregnancy.  Delivering your baby early, if your condition gets worse. You may have your labor started with medicine (induced), or you may have a cesarean delivery. Follow these instructions at home: Eating and drinking   Drink enough fluid to keep your urine pale yellow.  Avoid caffeine. Lifestyle  Do not use any products that contain nicotine or tobacco, such as cigarettes and e-cigarettes. If you need help quitting, ask your health care provider.  Do not use alcohol or drugs.  Avoid stress as much as possible. Rest and get plenty of sleep. General instructions  Take over-the-counter and prescription medicines only as told by your health care provider.  When lying down, lie on your left side. This keeps pressure off your major blood vessels.  When sitting or lying down, raise (elevate) your feet. Try putting  some pillows underneath your lower legs.  Exercise regularly. Ask your health care provider what kinds of exercise are best for you.  Keep all follow-up and prenatal visits as told by your health care provider. This is important. How is this prevented? There is no known way of preventing preeclampsia or eclampsia from developing. However, to lower your risk of complications and detect problems early:  Get regular prenatal care. Your health care provider may be able to diagnose and treat the condition early.  Maintain a healthy weight. Ask your health care provider for help managing weight gain during pregnancy.  Work with your health care provider to manage any long-term (chronic) health conditions you have, such as diabetes or kidney problems.  You may have tests of your blood pressure and  kidney function after giving birth.  Your health care provider may have you take low-dose aspirin during your next pregnancy. Contact a health care provider if:  You have symptoms that your health care provider told you may require more treatment or monitoring, such as: ? Headaches. ? Nausea or vomiting. ? Abdominal pain. ? Dizziness. ? Light-headedness. Get help right away if:  You have severe: ? Abdominal pain. ? Headaches that do not get better. ? Dizziness. ? Vision problems. ? Confusion. ? Nausea or vomiting.  You have any of the following: ? A seizure. ? Sudden, rapid weight gain. ? Sudden swelling in your hands, ankles, or face. ? Trouble moving any part of your body. ? Numbness in any part of your body. ? Trouble speaking. ? Abnormal bleeding.  You faint. Summary  Preeclampsia is a serious condition that may develop during pregnancy. It is also called toxemia of pregnancy.  This condition causes high blood pressure along with other symptoms, such as swelling and headaches.  Diagnosing and treating preeclampsia early is very important. If not treated early, it can cause  serious problems for you and your baby.  Get help right away if you have symptoms that your health care provider told you to watch for. This information is not intended to replace advice given to you by your health care provider. Make sure you discuss any questions you have with your health care provider. Document Released: 11/22/2000 Document Revised: 11/11/2017 Document Reviewed: 07/01/2016 Elsevier Interactive Patient Education  2019 [employer].

## 2018-12-24 NOTE — Progress Notes (Signed)
Pt discharged home with printed instructions. Pt verbalized an understanding. No concerns noted. Jaelie Aguilera L Ronnie Doo, RN 

## 2018-12-24 NOTE — Lactation Note (Signed)
This note was copied from a baby's chart. Lactation Consultation Note: Experienced BF mom reports baby has fed better through the night. Has just finished feeding for 30 min, Reports she fed for 30-60 min each feeding with a good latch- no pain. Reports breasts are feeling a little fuller this morning. No questions at present. Has her own Medela pump for home. Reviewed our phone number, OP appointments and BFSG as resources for support after DC. To call prn  Patient Name: Joyce Schneider GLOVF'I Date: 12/24/2018 Reason for consult: Follow-up assessment;Early term 37-38.6wks   Maternal Data Formula Feeding for Exclusion: No Has patient been taught Hand Expression?: Yes Does the patient have breastfeeding experience prior to this delivery?: Yes  Feeding    LATCH Score                   Interventions    Lactation Tools Discussed/Used     Consult Status Consult Status: Complete    Pamelia Hoit 12/24/2018, 7:53 AM

## 2018-12-24 NOTE — Discharge Summary (Signed)
OB Discharge Summary     Patient Name: Joyce Schneider DOB: [birth year] MRN: [id number]  Date of admission: 12/21/2018 Delivering MD: Myna Hidalgo   Date of discharge: 12/24/2018  Admitting diagnosis: 37WKS WATER BROKE Intrauterine pregnancy: [redacted]w[redacted]d     Secondary diagnosis:  Active Problems:   PROM (premature rupture of membranes)  Additional problems: Preeclampsia with severe features, Obesity     Discharge diagnosis: Term Pregnancy Delivered and Preeclampsia (severe)                                                                                                Post partum procedures:none  Augmentation: n/a  Complications: None  Hospital course:  Onset of Labor With Vaginal Delivery     28 y.o. yo G3P3003 at [redacted]w[redacted]d was admitted in Latent Labor on 12/21/2018. Patient had an uncomplicated labor course as follows:  Membrane Rupture Time/Date: 11:00 PM ,12/21/2018   Intrapartum Procedures: Episiotomy: None [1]                                         Lacerations:  None [1]  Patient had a delivery of a Viable infant. 12/22/2018  Information for the patient's newborn:  Nazira, Lamb [[id number]]  Delivery Method: Vag-Spont    Due to preeclampsia, she received IV Magnesium x 24hrs and was started on Procardia 30XL for BP control.  She is ambulating, tolerating a regular diet, passing flatus, and urinating well. Patient is discharged home in stable condition on 12/24/18.   Physical exam  Vitals:   12/24/18 0016 12/24/18 0441 12/24/18 0453 12/24/18 0832  BP: 130/90 (!) 150/91 (!) 155/91 133/77  Pulse: 75 67 60 82  Resp: 17 17  18   Temp: 98.2 F (36.8 C) 98 F (36.7 C)  (!) 97.4 F (36.3 C)  TempSrc: Oral Oral  Oral  SpO2: 99% 98%  100%  Weight:      Height:       General: alert, cooperative and no distress Lochia: appropriate Uterine Fundus: firm Incision: N/A DVT Evaluation: No evidence of DVT seen on physical exam. Labs: Lab Results  Component Value Date   WBC  7.9 12/23/2018   HGB 8.9 (L) 12/23/2018   HCT 28.0 (L) 12/23/2018   MCV 81.6 12/23/2018   PLT 132 (L) 12/23/2018   CMP Latest Ref Rng & Units 12/23/2018  Glucose 70 - 99 mg/dL 149(F)  BUN 6 - 20 mg/dL 8  Creatinine 0.26 - 3.78 mg/dL 5.88  Sodium 502 - 774 mmol/L 137  Potassium 3.5 - 5.1 mmol/L 4.0  Chloride 98 - 111 mmol/L 112(H)  CO2 22 - 32 mmol/L 21(L)  Calcium 8.9 - 10.3 mg/dL 7.6(L)  Total Protein 6.5 - 8.1 g/dL 4.2(L)  Total Bilirubin 0.3 - 1.2 mg/dL 0.3  Alkaline Phos 38 - 126 U/L 97  AST 15 - 41 U/L 22  ALT 0 - 44 U/L 12    Discharge instruction: per After Visit Summary and "Baby and Me Booklet".  After visit meds:  Allergies as of 12/24/2018   No Known Allergies     Medication List    TAKE these medications   escitalopram 20 MG tablet Commonly known as:  LEXAPRO Take 20 mg by mouth at bedtime.   ibuprofen 600 MG tablet Commonly known as:  ADVIL,MOTRIN Take 1 tablet (600 mg total) by mouth every 6 (six) hours.   NIFEdipine 30 MG 24 hr tablet Commonly known as:  ADALAT CC Take 1 tablet (30 mg total) by mouth daily for 30 days.   prenatal multivitamin Tabs tablet Take 1 tablet by mouth every morning.       Diet: routine diet  Activity: Advance as tolerated. Pelvic rest for 6 weeks.   Outpatient follow up:1 week Follow up Appt:No future appointments. Follow up Visit:No follow-ups on file.  Postpartum contraception: plan for tubal ligation 6wk postpartum  Newborn Data: Live born female  Birth Weight: 7 lb 9.5 oz (3445 g) APGAR: 9, 9  Newborn Delivery   Birth date/time:  12/22/2018 06:52:00 Delivery type:  Vaginal, Spontaneous     Baby Feeding: Breast Disposition:home with mother   12/24/2018 [name], DO

## 2018-12-24 NOTE — Progress Notes (Signed)
Postpartum Note Day # 2  S:  Patient resting comfortable in bed.  Pain controlled.  Tolerating general diet. + flatus, +BM.  Lochia moderate.  Ambulating without difficulty.  She denies n/v/f/c, SOB, or CP.  Pt plans on breastfeeding.  Denies headache, blurry vision, no RUQ pain  O: Temp:  [97.6 F (36.4 C)-99.2 F (37.3 C)] 98 F (36.7 C) (01/16 0441) Pulse Rate:  [60-83] 60 (01/16 0453) Resp:  [17-20] 17 (01/16 0441) BP: (130-157)/(76-92) 155/91 (01/16 0453) SpO2:  [98 %-100 %] 98 % (01/16 0441)  BP range: 130-155/76-92  Gen: A&Ox3, NAD CV: RRR Resp: CTAB Abdomen: obese, soft, NT, ND Uterus: firm, non-tender, below umbilicus Ext: 2+ non-pitting edema- slightly improved, no calf tenderness bilaterally, 2+ DTRs bilaterally  A/P: Pt is a 28 y.o. [id number] s/p NSVD complicated by preeclampsia with severe features, PPD#2  -Preeclampsia  -s/p mag x 24hrs  -start procardia XL 30mg  daily  -pt asymptomatic - Pain well controlled -GU: UOP is adequate -GI: Tolerating general diet -Activity: encouraged sitting up to chair and ambulation as tolerated -Prophylaxis: early ambulation  -If BP stable this am, plan to discharge home later today  Myna Hidalgo, DO [phone] (cell) [phone] (office)

## 2019-01-13 DIAGNOSIS — Z3482 Encounter for supervision of other normal pregnancy, second trimester: Secondary | ICD-10-CM | POA: Diagnosis not present

## 2019-01-13 DIAGNOSIS — O9989 Other specified diseases and conditions complicating pregnancy, childbirth and the puerperium: Secondary | ICD-10-CM | POA: Diagnosis not present

## 2019-04-06 MED FILL — ESCITALOPRAM 20 MG TABLET: 20 | 30 days supply | Qty: 30 | Fill #0

## 2019-05-07 MED FILL — ESCITALOPRAM 20 MG TABLET: 20 | 30 days supply | Qty: 30 | Fill #1

## 2019-10-08 ENCOUNTER — Other Ambulatory Visit: Payer: Self-pay

## 2019-10-08 DIAGNOSIS — Z20822 Contact with and (suspected) exposure to covid-19: Secondary | ICD-10-CM

## 2019-10-10 LAB — NOVEL CORONAVIRUS, NAA: SARS-CoV-2, NAA: DETECTED — AB

## 2019-10-20 ENCOUNTER — Ambulatory Visit: Payer: 59 | Admitting: Podiatry

## 2019-11-15 ENCOUNTER — Encounter

## 2019-11-15 ENCOUNTER — Ambulatory Visit: Payer: 59 | Admitting: Podiatry

## 2019-11-15 ENCOUNTER — Encounter: Payer: Self-pay | Admitting: Podiatry

## 2019-11-15 ENCOUNTER — Other Ambulatory Visit: Payer: Self-pay

## 2019-11-15 DIAGNOSIS — B351 Tinea unguium: Secondary | ICD-10-CM | POA: Diagnosis not present

## 2019-11-15 MED ORDER — TERBINAFINE HCL 250 MG PO TABS: 250.0000 mg | ORAL_TABLET | Freq: Every day | ORAL | 2 refills | Status: DC

## 2019-11-16 ENCOUNTER — Telehealth: Payer: Self-pay | Admitting: Podiatry

## 2019-11-16 MED ORDER — TERBINAFINE HCL 250 MG PO TABS: 250.0000 mg | ORAL_TABLET | Freq: Every day | ORAL | 2 refills | Status: AC

## 2019-11-16 NOTE — Addendum Note (Signed)
Addended by: Harriett Sine D on: 11/16/2019 10:05 AM   Modules accepted: Orders

## 2019-11-16 NOTE — Telephone Encounter (Signed)
I informed pt the rx had be sent to the [facility].

## 2019-11-16 NOTE — Telephone Encounter (Signed)
Pt had a prescription sent in yesterday and gave the incorrect pharmacy for it to be sent to.  Correct pharmacy is [facility].

## 2019-11-18 NOTE — Progress Notes (Signed)
   Subjective: 28 year old female presenting today for follow up evaluation of fungal nails of the bilateral great toes. She reports continued discoloration and states they now seem to be detached from the nail bed. She reports some associated discomfort. She has not had any treatment at home. Touching and applying pressure to the nails increases the tenderness. Patient is here for further evaluation and treatment.   Past Medical History:  Diagnosis Date  . Anxiety   . Depression    pp depression  . GERD (gastroesophageal reflux disease)     Objective: Physical Exam General: The patient is alert and oriented x3 in no acute distress.  Dermatology: Hyperkeratotic, discolored, thickened, onychodystrophy of bilateral great toenails noted. Skin is warm, dry and supple bilateral lower extremities. Negative for open lesions or macerations.  Vascular: Palpable pedal pulses bilaterally. No edema or erythema noted. Capillary refill within normal limits.  Neurological: Epicritic and protective threshold grossly intact bilaterally.   Musculoskeletal Exam: Range of motion within normal limits to all pedal and ankle joints bilateral. Muscle strength 5/5 in all groups bilateral.   Assessment: #1 onychomycosis bilateral great toenails   Plan of Care:  #1 Patient was evaluated. #2 Prescription for Lamisil 250 mg #90 to be taken daily provided to patient.  #3 Recommended OTC Biotin supplement.  #4 Return to clinic as needed.    Edrick Kins, DPM [facility]  Dr. Edrick Kins, [address]                                        [place], [place] [address]                Office [phone]  Fax [phone]

## 2019-11-29 ENCOUNTER — Ambulatory Visit: Payer: 59 | Admitting: Podiatry

## 2020-05-09 IMAGING — US US MFM OB DETAIL+14 WK
1 series · 13 of 28 positions shown · non-contrast
Comparison: none

[Series 1: us mfm ob detail+14 wk · 13 of 88 slices shown]
[im 4/88]
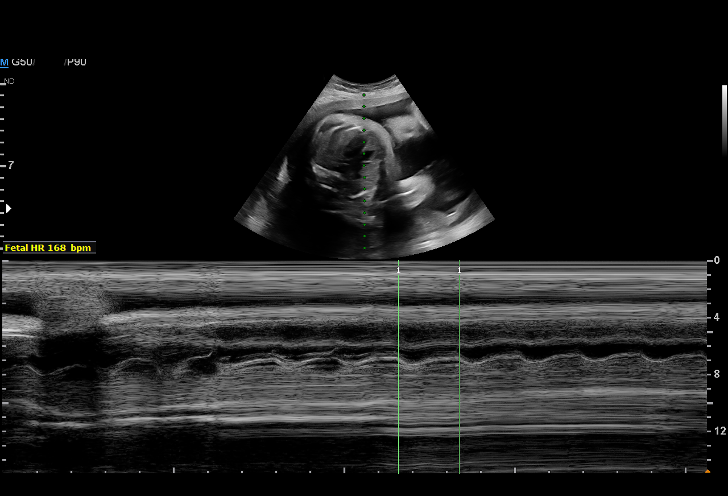
[im 10/88]
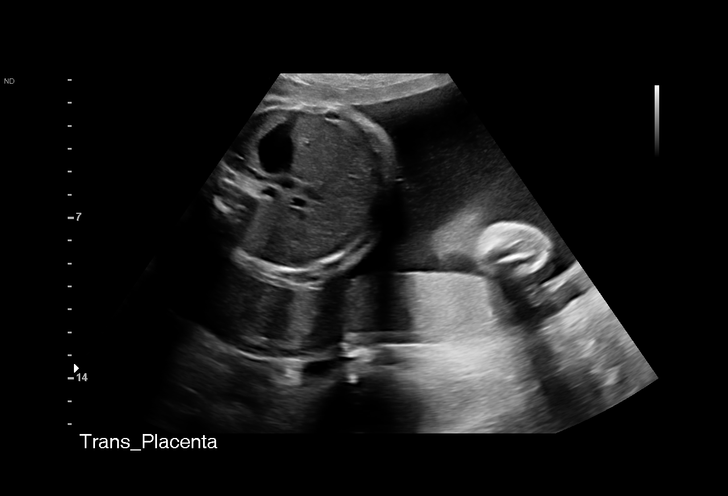
[im 17/88]
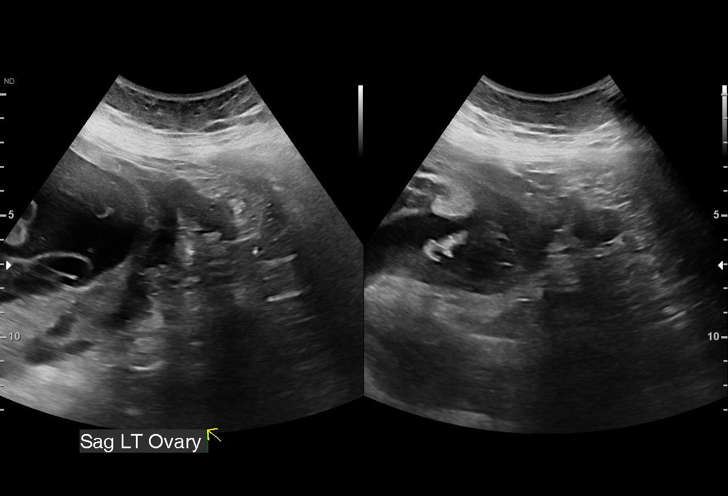
[im 23/88]
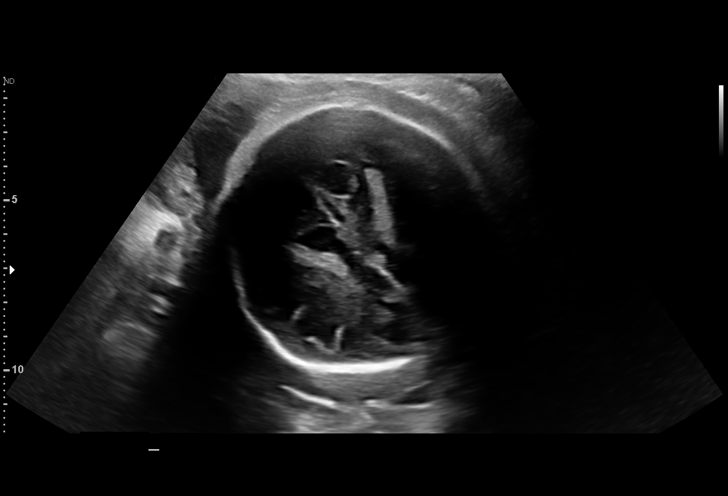
[im 30/88]
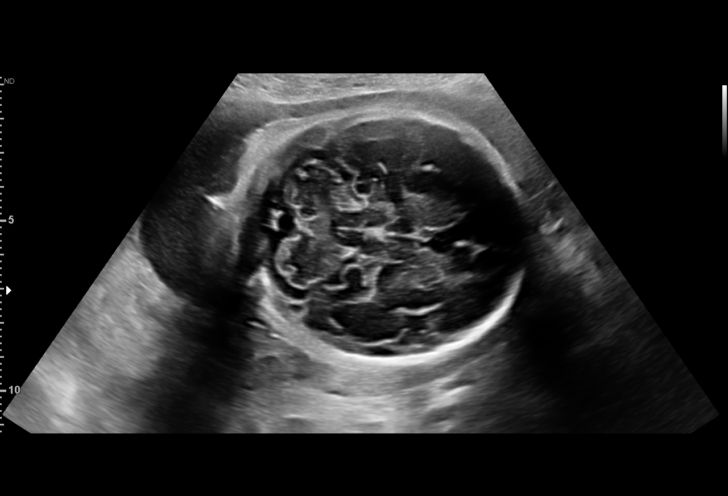
[im 36/88]
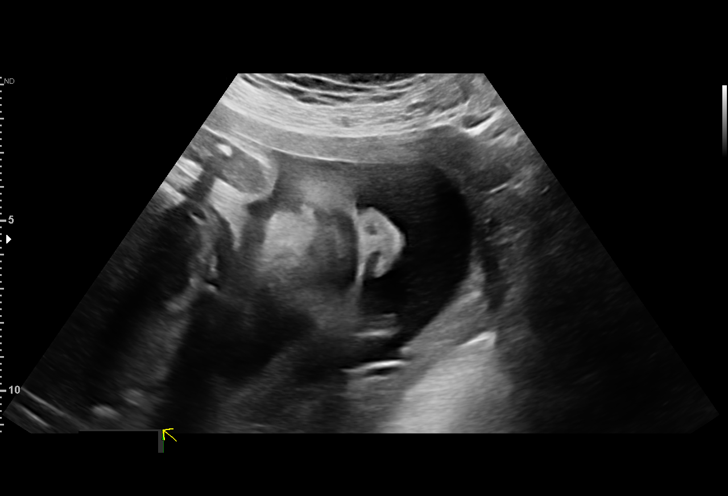
[im 46/88]
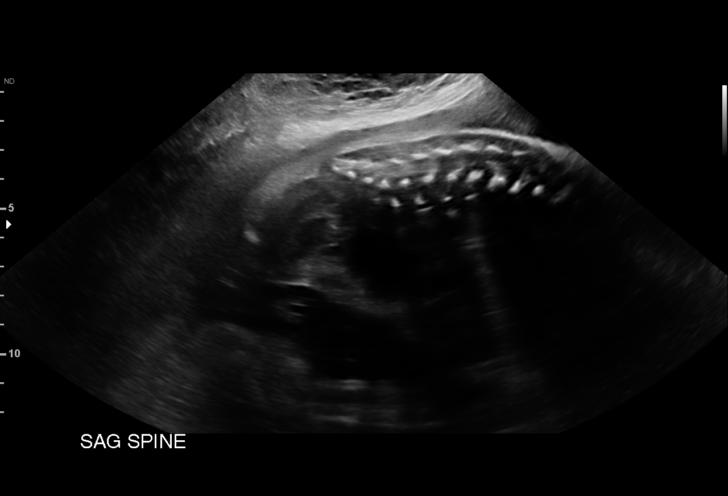
[im 52/88]
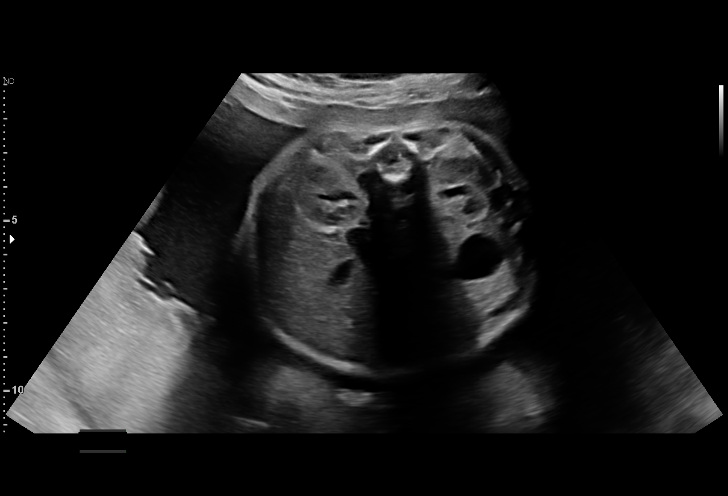
[im 59/88]
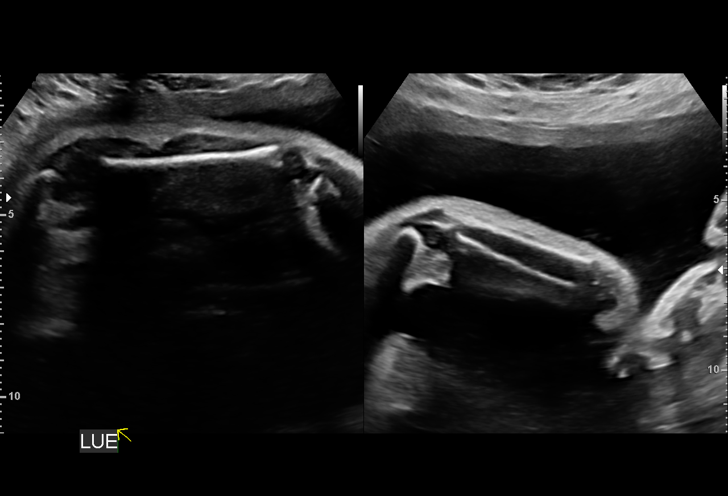
[im 65/88]
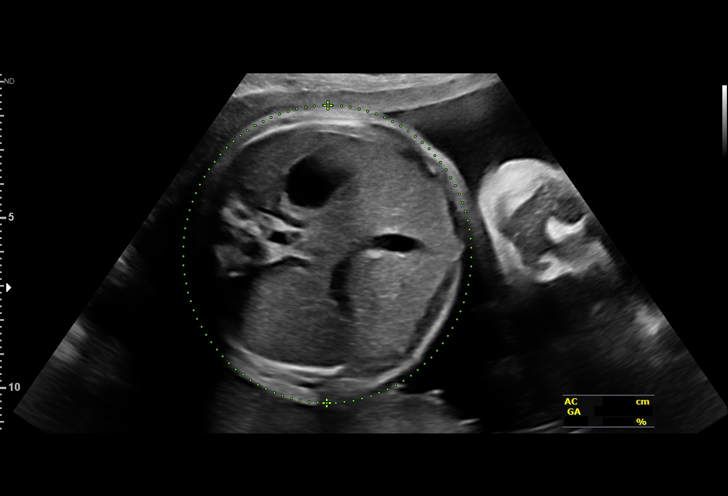
[im 71/88]
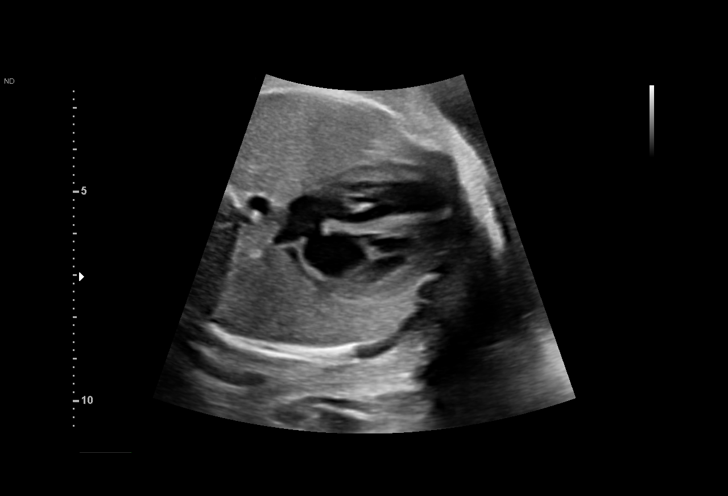
[im 78/88]
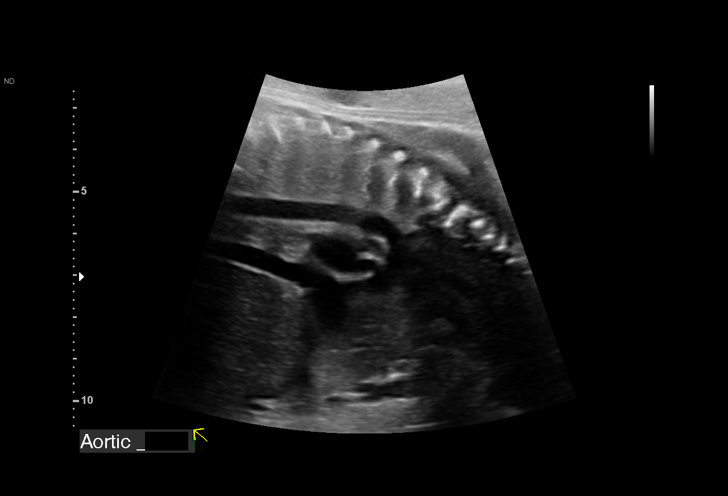
[im 84/88]
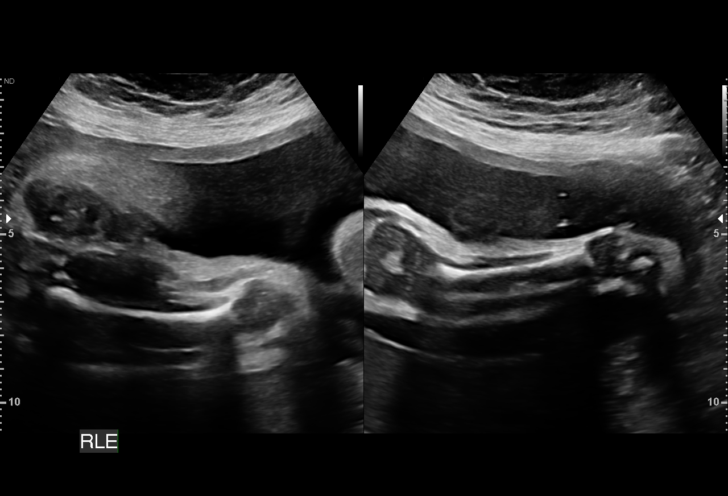

[13 of 28 positions shown; findings below may reference images not displayed]

----------------------------------------------------------------------

 ----------------------------------------------------------------------
Indications

  29 weeks gestation of pregnancy
  Fetal cardiac anomaly affecting pregnancy,
  antepartum
  Encounter for antenatal screening for
  malformations
  Size-Date Discrepancy (LGA)
 ----------------------------------------------------------------------
Fetal Evaluation

 Num Of Fetuses:         1
 Fetal Heart Rate(bpm):  168
 Cardiac Activity:       Observed
 Presentation:           Cephalic
 Placenta:               Posterior Fundal
 P. Cord Insertion:      Visualized

 Amniotic Fluid
 AFI FV:      Subjectively upper-normal

 AFI Sum(cm)     %Tile       Largest Pocket(cm)
 20.64           82

 RUQ(cm)       RLQ(cm)       LUQ(cm)        LLQ(cm)

Biometry

 BPD:      76.8  mm     G. Age:  30w 6d         76  %    CI:        77.18   %    70 - 86
                                                         FL/HC:      19.5   %    19.2 -
 HC:      276.8  mm     G. Age:  30w 2d         36  %    HC/AC:      1.01        0.99 -
 AC:      273.7  mm     G. Age:  31w 3d         91  %    FL/BPD:     70.4   %    71 - 87
 FL:       54.1  mm     G. Age:  28w 4d         14  %    FL/AC:      19.8   %    20 - 24
 HUM:      50.4  mm     G. Age:  29w 4d         48  %
 CER:      38.4  mm     G. Age:  32w 5d       > 95  %

 LV:        3.9  mm
 CM:        4.7  mm

 Est. FW:    5075  gm      3 lb 7 oz     68  %
OB History

 Gravidity:    3         Term:   2        Prem:   0        SAB:   0
 TOP:          0       Ectopic:  0        Living: 2
Gestational Age

 LMP:           29w 4d        Date:  04/04/18                 EDD:   01/09/19
 U/S Today:     30w 2d                                        EDD:   01/04/19
 Best:          29w 4d     Det. By:  LMP  (04/04/18)          EDD:   01/09/19
Anatomy

 Cranium:               Appears normal         LVOT:                   Appears normal
 Cavum:                 Appears normal         Aortic Arch:            Appears normal
 Ventricles:            Appears normal         Ductal Arch:            Not well visualized
 Choroid Plexus:        Appears normal         Diaphragm:              Appears normal
 Cerebellum:            Appears normal         Stomach:                Appears normal, left
                                                                       sided
 Posterior Fossa:       Appears normal         Abdomen:                Appears normal
 Nuchal Fold:           Not applicable (>20    Abdominal Wall:         Appears nml (cord
                        wks GA)                                        insert, abd wall)
 Face:                  Appears normal         Cord Vessels:           Appears normal (3
                        (orbits and profile)                           vessel cord)
 Lips:                  Appears normal         Kidneys:                Appear normal
 Palate:                Not well visualized    Bladder:                Appears normal
 Thoracic:              Appears normal         Spine:                  Appears normal
 Heart:                 Echogenic focus        Upper Extremities:      Visualized
                        in LV
 RVOT:                  Appears normal         Lower Extremities:      Visualized

 Other:  Fetus appears to be female. Nasal bone visualized. Technically
         difficult due to advanced gestational age. Complete exam performed
         in office.
Cervix Uterus Adnexa

 Cervix
 Length:           2.97  cm.
 Normal appearance by transabdominal scan.

 Left Ovary
 Within normal limits.

 Right Ovary
 Within normal limits.

 Adnexa
 No abnormality visualized.
Comments

 U/S images reviewed. Findings reviewed with patient.
 Appropriate fetal growth is noted.   An echogenic intra-
 cardiac focus was found on today's U/S as well as a
 prominent foramen ovale.  No other fetal abnormalities are
 seen.
 The risks and benefits of amniocentesis including a  1/500
 risk of pregnancy complications per amnio were explained.
 Also discussed was the alternative non-invasive perinatal
 testing (NIPT) with cell free DNA which will identify 99.1 % of
 fetuses with Trisomy 21.  Amniocentesis and NIPT declined.
 Questions answered.
 20 minutes spent face to face with patient.
 Recommendations: 1) Completion of cardiac anatomy and
 growth in 4 weeks
Recommendations

 1) Completion of cardiac anatomy and growth in 4 weeks

              Lauvvaa, Abubekar Adem

## 2021-02-26 ENCOUNTER — Other Ambulatory Visit: Payer: Self-pay

## 2021-02-26 ENCOUNTER — Ambulatory Visit: Payer: 59 | Admitting: Podiatry

## 2021-02-26 DIAGNOSIS — L600 Ingrowing nail: Secondary | ICD-10-CM

## 2021-02-26 MED ORDER — DOXYCYCLINE HYCLATE 100 MG PO TABS: 100.0000 mg | ORAL_TABLET | Freq: Two times a day (BID) | ORAL | 0 refills | Status: AC

## 2021-02-26 MED ORDER — GENTAMICIN SULFATE 0.1 % EX CREA: 1.0000 "application " | TOPICAL_CREAM | Freq: Two times a day (BID) | CUTANEOUS | 1 refills | Status: AC

## 2021-02-26 NOTE — Progress Notes (Signed)
   Subjective: Patient presents today for evaluation of pain to the medial border bilateral great toes. Patient is concerned for possible ingrown nail. Patient presents today for further treatment and evaluation.  Past Medical History:  Diagnosis Date  . Anxiety   . Depression    pp depression  . GERD (gastroesophageal reflux disease)     Objective:  General: Well developed, nourished, in no acute distress, alert and oriented x3   Dermatology: Skin is warm, dry and supple bilateral.  Medial border bilateral great toes appears to be erythematous with evidence of an ingrowing nail. Pain on palpation noted to the border of the nail fold. The remaining nails appear unremarkable at this time. There are no open sores, lesions.  Vascular: Dorsalis Pedis artery and Posterior Tibial artery pedal pulses palpable. No lower extremity edema noted.   Neruologic: Grossly intact via light touch bilateral.  Musculoskeletal: Muscular strength within normal limits in all groups bilateral. Normal range of motion noted to all pedal and ankle joints.   Assesement: #1 Paronychia with ingrowing nail medial border bilateral great toes #2 Pain in toe #3 Incurvated nail  Plan of Care:  1. Patient evaluated.  2. Discussed treatment alternatives and plan of care. Explained nail avulsion procedure and post procedure course to patient. 3. Patient opted for permanent partial nail avulsion of the medial border bilateral great toes.  4. Prior to procedure, local anesthesia infiltration utilized using 3 ml of a 50:50 mixture of 2% plain lidocaine and 0.5% plain marcaine in a normal hallux block fashion and a betadine prep performed.  5. Partial permanent nail avulsion with chemical matrixectomy performed using 3x30sec applications of phenol followed by alcohol flush.  6. Light dressing applied. 7.  Prescription for doxycycline 100 mg 2 times daily #20  8.  Prescription for gentamicin cream apply 2 times daily  9.   Return to clinic 2 weeks.  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 Felecia Shelling, DPM    [address] [place], [place] [address]                Office [phone]  Fax [phone]

## 2021-02-27 ENCOUNTER — Encounter: Payer: Self-pay | Admitting: Podiatry

## 2021-03-12 ENCOUNTER — Ambulatory Visit (INDEPENDENT_AMBULATORY_CARE_PROVIDER_SITE_OTHER): Payer: 59 | Admitting: Podiatry

## 2021-03-12 ENCOUNTER — Other Ambulatory Visit: Payer: Self-pay

## 2021-03-12 DIAGNOSIS — L600 Ingrowing nail: Secondary | ICD-10-CM | POA: Diagnosis not present

## 2021-03-12 NOTE — Progress Notes (Signed)
   Subjective: 30 y.o. female presents today status post permanent nail avulsion procedure of the medial border of the bilateral great toes that was performed on 02/26/2021.  Patient states she is doing very well.  No new complaints at this time.  She has been soaking her foot and applying the antibiotic cream as instructed.   Past Medical History:  Diagnosis Date  . Anxiety   . Depression    pp depression  . GERD (gastroesophageal reflux disease)     Objective: Skin is warm, dry and supple. Nail and respective nail fold appears to be healing appropriately. Open wound to the associated nail fold with a granular wound base and moderate amount of fibrotic tissue. Minimal drainage noted. Mild erythema around the periungual region likely due to phenol chemical matricectomy.  Assessment: #1 postop permanent partial nail avulsion medial border bilateral great toes #2 open wound periungual nail fold of respective digit.   Plan of care: #1 patient was evaluated  #2 debridement of open wound was performed to the periungual border of the respective toe using a currette. Antibiotic ointment and Band-Aid was applied. #3 patient is to return to clinic on a PRN basis.   Felecia Shelling, DPM [facility]  Dr. Felecia Shelling, DPM    [address] [place], [place] [address]                Office [phone]  Fax [phone]
